# Patient Record
Sex: Male | Born: 1950 | Hispanic: No | Marital: Married | Smoking: Former smoker
Health system: Southern US, Community
[De-identification: ages and names within clinical notes are randomized; demographics above are authoritative.]

## PROBLEM LIST (undated history)

## (undated) ENCOUNTER — Emergency Department (HOSPITAL_COMMUNITY): Admission: EM | Payer: BC Managed Care – PPO | Source: Home / Self Care

## (undated) DIAGNOSIS — I10 Essential (primary) hypertension: Secondary | ICD-10-CM

## (undated) DIAGNOSIS — E785 Hyperlipidemia, unspecified: Secondary | ICD-10-CM

## (undated) DIAGNOSIS — R0683 Snoring: Secondary | ICD-10-CM

## (undated) DIAGNOSIS — Z973 Presence of spectacles and contact lenses: Secondary | ICD-10-CM

## (undated) DIAGNOSIS — R0602 Shortness of breath: Secondary | ICD-10-CM

## (undated) DIAGNOSIS — I6529 Occlusion and stenosis of unspecified carotid artery: Secondary | ICD-10-CM

## (undated) DIAGNOSIS — K5792 Diverticulitis of intestine, part unspecified, without perforation or abscess without bleeding: Secondary | ICD-10-CM

## (undated) DIAGNOSIS — F5109 Other insomnia not due to a substance or known physiological condition: Secondary | ICD-10-CM

## (undated) DIAGNOSIS — I2510 Atherosclerotic heart disease of native coronary artery without angina pectoris: Secondary | ICD-10-CM

## (undated) HISTORY — DX: Occlusion and stenosis of unspecified carotid artery: I65.29

## (undated) HISTORY — PX: COLONOSCOPY: SHX174

## (undated) HISTORY — PX: CARDIAC CATHETERIZATION: SHX172

## (undated) HISTORY — DX: Other insomnia not due to a substance or known physiological condition: F51.09

## (undated) HISTORY — DX: Snoring: R06.83

## (undated) HISTORY — DX: Essential (primary) hypertension: I10

## (undated) HISTORY — PX: ROOT CANAL: SHX2363

---

## 2004-09-08 ENCOUNTER — Ambulatory Visit (HOSPITAL_COMMUNITY): Admission: RE | Admit: 2004-09-08 | Discharge: 2004-09-08 | Payer: Self-pay | Admitting: Family Medicine

## 2004-09-19 ENCOUNTER — Encounter: Admission: RE | Admit: 2004-09-19 | Discharge: 2004-09-19 | Payer: Self-pay | Admitting: Family Medicine

## 2008-11-07 ENCOUNTER — Encounter: Admission: RE | Admit: 2008-11-07 | Discharge: 2008-11-07 | Payer: Self-pay | Admitting: Family Medicine

## 2010-08-26 ENCOUNTER — Encounter: Payer: Self-pay | Admitting: Family Medicine

## 2014-02-14 ENCOUNTER — Other Ambulatory Visit: Payer: Self-pay | Admitting: Cardiology

## 2014-02-14 NOTE — H&P (Signed)
HISTORY AND PHYSICAL    Ryan Douglas, Ryan Douglas  Date of visit:  02/14/2014 DOB:  July 19, 1951    Age:  63 yrs. Medical record number:  [id number]     Account number:  [id number] Primary Care Provider: [name], [name] ____________________________ CURRENT DIAGNOSES  1. Chest Pain  2. Abnormal Exercise Stress Test  3. Angina Pectoris  4. Hyperlipidemia ____________________________ ALLERGIES  No Known Allergies ____________________________ MEDICATIONS  1. aspirin 81 mg chewable tablet, 1 p.o. daily  2. multivitamin tablet, 1 p.o. daily  3. Miralax 17 gram/dose oral powder, PRN  4. Align 4 mg capsule, Take as directed  5. metoprolol succinate ER 25 mg tablet,extended release 24 hr, 1 p.o. daily ____________________________ CHIEF COMPLAINTS  Chest discomfort with walking ____________________________ HISTORY OF PRESENT ILLNESS  This very nice 63 year old black male is seen at the request of Dr. [name] for evaluation of chest pain. The patient has previously been in good health except for a history of hyperlipidemia and a history of diverticulosis. He has no prior cardiac history but presents with a two-month history of progressive exertional shortness of breath and midsternal tightness with radiation to the jaw. The patient formally was able to jog and was able to go on a 4 mile walk with his dog this but starting this winter began to have cold-induced exertional chest tightness. The past 2 months his tightness and shortness of breath has become progressive and typically will be relieved with rest and will recur shortly after resumption of exertion.  He has had no symptoms at rest. His parents lived to be an old age. He does have untreated hyperlipidemia but does not have any history of hypertension or diabetes. He quit smoking over 20 years ago and smoked only briefly. He has had to curtail his activities because of the exertional chest discomfort. He has a somewhat sedentary job. He denies PND, orthopnea,  syncope, or claudication.  He exercised on a treadmill today and walked only 3:37 minutes and had 2-3 mm of ST segment depression in the anterolateral leads that became associated with T-wave inversions. He had typical angina. He was started on treatment with metoprolol 25 mg extended release, Crestor 10 mg daily and continued on aspirin. It was recommended that he undergo cardiac catheterization and he is agreeable.  ____________________________ PAST HISTORY  Past Medical Illnesses:  hyperlipidemia, diverticulitis;  Cardiovascular Illnesses:  no previous history of cardiac disease.;  Surgical Procedures:  no prior surgical procedures;  Cardiology Procedures-Invasive:  no history of prior cardiac procedures;  Cardiology Procedures-Noninvasive:  treadmill;  LVEF not documented,   ____________________________ CARDIO-PULMONARY TEST DATES EKG Date:  02/14/2014;  Chest Xray Date: 02/09/2014;   ____________________________ FAMILY HISTORY Brother -- Brother dead, Death of unknown cause Father -- Father dead, CVA Mother -- Mother dead, Carcinoma of breast, Coronary Artery Disease Sister -- Sister alive and well ____________________________ SOCIAL HISTORY Alcohol Use:  no alcohol use;  Smoking:  used to smoke but quit 1994, 20 pack year history;  Diet:  fat modified diet and vegetarian;  Lifestyle:  married;  Exercise:  walking for approximately 45 minutes 4 days per week;  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 Eyes: wears eye glasses/contact lenses, denies diplopia, glaucoma or visual field defects. Ears, Nose, Throat, Mouth:  denies any hearing loss, epistaxis, hoarseness or difficulty speaking. Respiratory: denies dyspnea,  cough, wheezing or hemoptysis. Cardiovascular:  please review HPI Abdominal: constipation  Genitourinary-Male: no dysuria, urgency, frequency, or nocturia  Musculoskeletal:  chronic low back pain Neurological:  denies headaches, stroke, or TIA Psychiatric:  denies depession or anxiety  ____________________________ PHYSICAL EXAMINATION VITAL SIGNS  Blood Pressure:  124/60 Sitting, Right arm, regular cuff  , 128/66 Standing, Right arm and regular cuff   Pulse:  78/min. Weight:  188.00 lbs. Height:  71"BMI: 26  Constitutional:  pleasant African Americian male in no acute distress Skin:  warm and dry to touch, no apparent skin lesions, or masses noted. Head:  normocephalic, normal hair pattern, no masses or tenderness Eyes:  EOMS Intact, PERRLA, Douglas and S clear, Funduscopic exam not done. ENT:  ears, nose and throat reveal no gross abnormalities.  Dentition good. Neck:  supple, without massess. No JVD, thyromegaly or carotid bruits. Carotid upstroke normal. Chest:  normal symmetry, clear to auscultation. Cardiac:  regular rhythm, normal S1 and S2, No S3 or S4, no murmurs, gallops or rubs detected. Abdomen:  abdomen soft,non-tender, no masses, no hepatospenomegaly, or aneurysm noted Peripheral Pulses:  the femoral,dorsalis pedis, and posterior tibial pulses are full and equal bilaterally with no bruits auscultated. Extremities & Back:  no deformities, clubbing, cyanosis, erythema or edema observed. Normal muscle strength and tone. Neurological:  no gross motor or sensory deficits noted, affect appropriate, oriented x3. ___________________________ IMPRESSIONS/PLAN  1. Clinical story suggestive of progressive angina with an abnormal exercise treadmill test a low-level work 2. Untreated hyperlipidemia 3. Abnormal exercise tolerance test  RECOMMENDATIONS:  The patient has a suggestive clinical story of angina and an abnormal exercise treadmill test at a low level of work with reproduction of angina. He was started on metoprolol extended release 25 mg daily. I recommended that he undergo  cardiac catheterization. Cardiac catheterization was discussed with the patient including risks of myocardial infarction, death, stroke, bleeding, arrhythmia, dye allergy, or renal insufficiency. He understands and is willing to proceed. Possibility of percutaneous intervention at the same setting was also discussed with the patient including risks. We also discussed potential for bypass grafting if he has left main or severe disease. He will restrict his activities as the catheterization is planned in 3 days. ____________________________ TODAYS ORDERS  1. Draw PT/INR: Today  2. PTT: Today  3. 12 Lead EKG: Today                       ____________________________ Cardiology Physician:  Darden Palmer MD [facility]

## 2014-02-16 ENCOUNTER — Encounter (HOSPITAL_COMMUNITY): Payer: Self-pay | Admitting: Pharmacy Technician

## 2014-02-17 ENCOUNTER — Encounter (HOSPITAL_COMMUNITY)
Admission: RE | Disposition: A | Discharge status: Home / Self Care | Payer: Self-pay | Source: Ambulatory Visit | Attending: Cardiology

## 2014-02-17 ENCOUNTER — Ambulatory Visit (HOSPITAL_COMMUNITY)
Admission: RE | Admit: 2014-02-17 | Discharge: 2014-02-17 | Disposition: A | Discharge status: Home / Self Care | Payer: BC Managed Care – PPO | Source: Ambulatory Visit | Attending: Cardiology | Admitting: Cardiology

## 2014-02-17 ENCOUNTER — Encounter (HOSPITAL_COMMUNITY): Payer: Self-pay | Admitting: Cardiology

## 2014-02-17 DIAGNOSIS — E785 Hyperlipidemia, unspecified: Secondary | ICD-10-CM | POA: Insufficient documentation

## 2014-02-17 DIAGNOSIS — Z87891 Personal history of nicotine dependence: Secondary | ICD-10-CM | POA: Insufficient documentation

## 2014-02-17 DIAGNOSIS — I2582 Chronic total occlusion of coronary artery: Secondary | ICD-10-CM | POA: Insufficient documentation

## 2014-02-17 DIAGNOSIS — Z7982 Long term (current) use of aspirin: Secondary | ICD-10-CM | POA: Insufficient documentation

## 2014-02-17 DIAGNOSIS — I200 Unstable angina: Secondary | ICD-10-CM | POA: Insufficient documentation

## 2014-02-17 DIAGNOSIS — I2510 Atherosclerotic heart disease of native coronary artery without angina pectoris: Secondary | ICD-10-CM

## 2014-02-17 HISTORY — DX: Atherosclerotic heart disease of native coronary artery without angina pectoris: I25.10

## 2014-02-17 HISTORY — PX: LEFT HEART CATHETERIZATION WITH CORONARY ANGIOGRAM: SHX5451

## 2014-02-17 LAB — PROTIME-INR
INR: 1.11 (ref 0.00–1.49)
Prothrombin Time: 14.3 seconds (ref 11.6–15.2)

## 2014-02-17 SURGERY — LEFT HEART CATHETERIZATION WITH CORONARY ANGIOGRAM
Anesthesia: LOCAL

## 2014-02-17 MED ORDER — SODIUM CHLORIDE 0.9 % IV SOLN: 250.0000 mL | INTRAVENOUS | Status: DC | PRN

## 2014-02-17 MED ORDER — NITROGLYCERIN 0.2 MG/ML ON CALL CATH LAB
INTRAVENOUS | Status: AC
  Filled 2014-02-17: qty 1

## 2014-02-17 MED ORDER — HEPARIN (PORCINE) IN NACL 2-0.9 UNIT/ML-% IJ SOLN
INTRAMUSCULAR | Status: AC
  Filled 2014-02-17: qty 500

## 2014-02-17 MED ORDER — HEPARIN (PORCINE) IN NACL 2-0.9 UNIT/ML-% IJ SOLN
INTRAMUSCULAR | Status: AC
  Filled 2014-02-17: qty 1000

## 2014-02-17 MED ORDER — SODIUM CHLORIDE 0.9 % IV SOLN
INTRAVENOUS | Status: DC
  Administered 2014-02-17: 07:00:00 via INTRAVENOUS

## 2014-02-17 MED ORDER — SODIUM CHLORIDE 0.9 % IV SOLN: 1.0000 mL/kg/h | INTRAVENOUS | Status: DC

## 2014-02-17 MED ORDER — SODIUM CHLORIDE 0.9 % IJ SOLN: 3.0000 mL | INTRAMUSCULAR | Status: DC | PRN

## 2014-02-17 MED ORDER — ASPIRIN 81 MG PO CHEW: 81.0000 mg | CHEWABLE_TABLET | ORAL | Status: DC

## 2014-02-17 MED ORDER — SODIUM CHLORIDE 0.9 % IJ SOLN: 3.0000 mL | Freq: Two times a day (BID) | INTRAMUSCULAR | Status: DC

## 2014-02-17 MED ORDER — MIDAZOLAM HCL 2 MG/2ML IJ SOLN
INTRAMUSCULAR | Status: AC
Start: 2014-02-17 — End: 2014-02-17
  Filled 2014-02-17: qty 2

## 2014-02-17 MED ORDER — ASPIRIN 81 MG PO CHEW
CHEWABLE_TABLET | ORAL | Status: AC
Start: 2014-02-17 — End: 2014-02-17
  Administered 2014-02-17: 81 mg
  Filled 2014-02-17: qty 1

## 2014-02-17 MED ORDER — LIDOCAINE HCL (PF) 1 % IJ SOLN
INTRAMUSCULAR | Status: AC
  Filled 2014-02-17: qty 30

## 2014-02-17 NOTE — Progress Notes (Signed)
Voided 400 ml clear amber urine.

## 2014-02-17 NOTE — CV Procedure (Signed)
Cardiac Catheterization Report   Ryan Douglas    63 y.o.  male  DOB: 10/01/1950  MRN: [id number]  02/17/2014    PROCEDURE:  Left heart catheterization with selective coronary angiography, left ventriculogram.  INDICATIONS:  Progressive angina pectoris, strongly positive exercise treadmill test at low workload  The risks, benefits, and details of the procedure were explained to the patient.  The patient verbalized understanding and wanted to proceed.  Informed written consent was obtained.  PROCEDURE TECHNIQUE:  After Xylocaine anesthesia a 73F sheath was placed in the right femoral artery with a single anterior needle wall stick.   Left coronary angiography was done using a Judkins L4 guide catheter.  Right coronary angiography was done using a Judkins R4 guide catheter.  A 30 cc ventriculogram was performed in the 30 degree RAO projection.  Tolerated the procedure well.  Sheath removed in the holding area.   CONTRAST:  Total of 70 cc.  COMPLICATIONS:  None.    HEMODYNAMICS: Aortic postcontrast 147/87, left ventricle postcontrast 147/7-22.  There was no gradient between the left ventricle and aorta.    ANGIOGRAPHIC DATA:    CORONARY ARTERIES:   Arise and distribute normally. Right dominant. Moderate coronary calcification is noted.  Left main coronary artery: Mild irregularity.  Left anterior descending: Severe proximal 90-95% severe segmental stenosis, 70% stenosis at the margin of the diagonal branch. 95% stenosis at the apex. The LAD wraps around and supplies the distal inferior wall. Collateral  filling is seen of the circumflex and the distal right coronary artery  Circumflex coronary artery,: Severe proximal stenosis and is then totally occluded. Several marginal branches arise, large third marginal and a posterior lateral branch arise after a section of total occlusion and fills by collaterals from the left coronary and distal right  coronary artery   Right coronary artery: Severe 99% segmental proximal stenosis, distal 70-80% stenosis. The vessel supplies collaterals to septal perforator is well as the circumflex area and collaterals to the distal posterior descending artery arise from the left coronary system.  LEFT VENTRICULOGRAM:  Performed in the 30 RAO projection.  The aortic and mitral valves are normal. The left ventricle is normal in size with normal wall motion. Estimated ejection fraction is  60%.  IMPRESSIONS:  1. Severe three-vessel coronary artery disease  2. Normal left ventricle.Marland Kitchen.  RECOMMENDATION: referral for coronary artery bypass grafting. The stenoses do not appear amenable for percutaneous intervention.    Ryan Douglas, Jr. MD [facility]

## 2014-02-17 NOTE — Interval H&P Note (Signed)
History and Physical Interval Note:  02/17/2014 7:39 AM    Patient seen and examined.  No interval change in history and exam since last note.  Stable for procedure.  Darden PalmerW. Spencer Quina Wilbourne, Jr. MD [facility]  02/17/2014

## 2014-02-17 NOTE — Discharge Instructions (Signed)
Surgical Consult Appointment: Friday 02/18/2014 at 1:30pm with Dr. Tyrone SageGerhardt (arrive at least 15 minutes before appointment) [facility] [address], [place], [place] [address] [phone]  Angiogram, Care After  Refer to this sheet in the next few weeks. These instructions provide you with information on caring for yourself after your procedure. Your health care provider may also give you more specific instructions. Your treatment has been planned according to current medical practices, but problems sometimes occur. Call your health care provider if you have any problems or questions after your procedure.  WHAT TO EXPECT AFTER THE PROCEDURE After your procedure, it is typical to have the following sensations:  Minor discomfort or tenderness and a small bump at the catheter insertion site. The bump should usually decrease in size and tenderness within 1 to 2 weeks.  Any bruising will usually fade within 2 to 4 weeks. HOME CARE INSTRUCTIONS   You may need to keep taking blood thinners if they were prescribed for you. Only take over-the-counter or prescription medicines for pain, fever, or discomfort as directed by your health care provider.  Do not apply powder or lotion to the site.  Do not sit in a bathtub, swimming pool, or whirlpool for 5 to 7 days.  You may shower 24 hours after the procedure. Remove the bandage (dressing) and gently wash the site with plain soap and water. Gently pat the site dry.  Inspect the site at least twice daily.  Limit your activity for the first 24 hours. Do not bend, squat, or lift anything over 20 lb (9 kg) or as directed by your health care provider.  Do not drive home if you are discharged the day of the procedure. Have someone else drive you. Follow instructions about when you can drive or return to work. SEEK MEDICAL CARE IF:  You get lightheaded when standing up.  You have drainage (other than a small amount of  blood on the dressing).  You have chills.  You have a fever.  You have redness, warmth, swelling, or pain at the insertion site. SEEK IMMEDIATE MEDICAL CARE IF:   You develop chest pain or shortness of breath, feel faint, or pass out.  You have bleeding, swelling larger than a walnut, or drainage from the catheter insertion site.  You develop pain, discoloration, coldness, or severe bruising in the leg or arm that held the catheter.  You have heavy bleeding from the site. If this happens, hold pressure on the site. MAKE SURE YOU:  Understand these instructions.  Will watch your condition.  Will get help right away if you are not doing well or get worse. Document Released: 02/07/2005 Document Revised: 07/27/2013 Document Reviewed: 12/14/2012 [facility] Patient Information 2015 [place]. This information is not intended to replace advice given to you by your health care provider. Make sure you discuss any questions you have with your health care provider.

## 2014-02-17 NOTE — H&P (View-Only) (Signed)
HISTORY AND PHYSICAL    Ryan Douglas, Ryan Douglas  Date of visit:  02/14/2014 DOB:  July 19, 1951    Age:  63 yrs. Medical record number:  [id number]     Account number:  [id number] Primary Care Provider: [name], Ryan Douglas. ____________________________ CURRENT DIAGNOSES  1. Chest Pain  2. Abnormal Exercise Stress Test  3. Angina Pectoris  4. Hyperlipidemia ____________________________ ALLERGIES  No Known Allergies ____________________________ MEDICATIONS  1. aspirin 81 mg chewable tablet, 1 Douglas.o. daily  2. multivitamin tablet, 1 Douglas.o. daily  3. Miralax 17 gram/dose oral powder, PRN  4. Align 4 mg capsule, Take as directed  5. metoprolol succinate ER 25 mg tablet,extended release 24 hr, 1 Douglas.o. daily ____________________________ CHIEF COMPLAINTS  Chest discomfort with walking ____________________________ HISTORY OF PRESENT ILLNESS  This very nice 63 year old black male is seen at the request of Dr. Modesto Douglas for evaluation of chest pain. The patient has previously been in good health except for a history of hyperlipidemia and a history of diverticulosis. He has no prior cardiac history but presents with a two-month history of progressive exertional shortness of breath and midsternal tightness with radiation to the jaw. The patient formally was able to jog and was able to go on a 4 mile walk with his dog this but starting this winter began to have cold-induced exertional chest tightness. The past 2 months his tightness and shortness of breath has become progressive and typically will be relieved with rest and will recur shortly after resumption of exertion.  He has had no symptoms at rest. His parents lived to be an old age. He does have untreated hyperlipidemia but does not have any history of hypertension or diabetes. He quit smoking over 20 years ago and smoked only briefly. He has had to curtail his activities because of the exertional chest discomfort. He has a somewhat sedentary job. He denies PND, orthopnea,  syncope, or claudication.  He exercised on a treadmill today and walked only 3:37 minutes and had 2-3 mm of ST segment depression in the anterolateral leads that became associated with T-wave inversions. He had typical angina. He was started on treatment with metoprolol 25 mg extended release, Crestor 10 mg daily and continued on aspirin. It was recommended that he undergo cardiac catheterization and he is agreeable.  ____________________________ PAST HISTORY  Past Medical Illnesses:  hyperlipidemia, diverticulitis;  Cardiovascular Illnesses:  no previous history of cardiac disease.;  Surgical Procedures:  no prior surgical procedures;  Cardiology Procedures-Invasive:  no history of prior cardiac procedures;  Cardiology Procedures-Noninvasive:  treadmill;  LVEF not documented,   ____________________________ CARDIO-PULMONARY TEST DATES EKG Date:  02/14/2014;  Chest Xray Date: 02/09/2014;   ____________________________ FAMILY HISTORY Brother -- Brother dead, Death of unknown cause Father -- Father dead, CVA Mother -- Mother dead, Carcinoma of breast, Coronary Artery Disease Sister -- Sister alive and well ____________________________ SOCIAL HISTORY Alcohol Use:  no alcohol use;  Smoking:  used to smoke but quit 1994, 20 pack year history;  Diet:  fat modified diet and vegetarian;  Lifestyle:  married;  Exercise:  walking for approximately 45 minutes 4 days per week;  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 Eyes: wears eye glasses/contact lenses, denies diplopia, glaucoma or visual field defects. Ears, Nose, Throat, Mouth:  denies any hearing loss, epistaxis, hoarseness or difficulty speaking. Respiratory: denies dyspnea,  cough, wheezing or hemoptysis. Cardiovascular:  please review HPI Abdominal: constipation  Genitourinary-Male: no dysuria, urgency, frequency, or nocturia  Musculoskeletal:  chronic low back pain Neurological:  denies headaches, stroke, or TIA Psychiatric:  denies depession or anxiety  ____________________________ PHYSICAL EXAMINATION VITAL SIGNS  Blood Pressure:  124/60 Sitting, Right arm, regular cuff  , 128/66 Standing, Right arm and regular cuff   Pulse:  78/min. Weight:  188.00 lbs. Height:  71"BMI: 26  Constitutional:  pleasant African Americian male in no acute distress Skin:  warm and dry to touch, no apparent skin lesions, or masses noted. Head:  normocephalic, normal hair pattern, no masses or tenderness Eyes:  EOMS Intact, PERRLA, Douglas and S clear, Funduscopic exam not done. ENT:  ears, nose and throat reveal no gross abnormalities.  Dentition good. Neck:  supple, without massess. No JVD, thyromegaly or carotid bruits. Carotid upstroke normal. Chest:  normal symmetry, clear to auscultation. Cardiac:  regular rhythm, normal S1 and S2, No S3 or S4, no murmurs, gallops or rubs detected. Abdomen:  abdomen soft,non-tender, no masses, no hepatospenomegaly, or aneurysm noted Peripheral Pulses:  the femoral,dorsalis pedis, and posterior tibial pulses are full and equal bilaterally with no bruits auscultated. Extremities & Back:  no deformities, clubbing, cyanosis, erythema or edema observed. Normal muscle strength and tone. Neurological:  no gross motor or sensory deficits noted, affect appropriate, oriented x3. ___________________________ IMPRESSIONS/PLAN  1. Clinical story suggestive of progressive angina with an abnormal exercise treadmill test a low-level work 2. Untreated hyperlipidemia 3. Abnormal exercise tolerance test  RECOMMENDATIONS:  The patient has a suggestive clinical story of angina and an abnormal exercise treadmill test at a low level of work with reproduction of angina. He was started on metoprolol extended release 25 mg daily. I recommended that he undergo  cardiac catheterization. Cardiac catheterization was discussed with the patient including risks of myocardial infarction, death, stroke, bleeding, arrhythmia, dye allergy, or renal insufficiency. He understands and is willing to proceed. Possibility of percutaneous intervention at the same setting was also discussed with the patient including risks. We also discussed potential for bypass grafting if he has left main or severe disease. He will restrict his activities as the catheterization is planned in 3 days. ____________________________ TODAYS ORDERS  1. Draw PT/INR: Today  2. PTT: Today  3. 12 Lead EKG: Today                       ____________________________ Cardiology Physician:  Darden Palmer MD [facility]

## 2014-02-17 NOTE — Progress Notes (Signed)
Site area:   Site Prior to Removal:  Level   Pressure Applied For 20 MINUTES    Manual:   Yes, Kristian CoveyGina Garrett RRT pulled sheath from right femoral art.  Patient Status During Pull:  No complaint nor complications during sheath pull  Post Pull Groin Site:  Level 0  Post Pull Instructions Given:  yes  Post Pull Pulses Present: right DP 2+  Dressing Applied:  tegaderm   Bedrest [phone]  Comments tolerated well.

## 2014-02-18 ENCOUNTER — Encounter: Payer: BC Managed Care – PPO | Admitting: Cardiothoracic Surgery

## 2014-02-18 ENCOUNTER — Encounter: Payer: Self-pay | Admitting: Cardiology

## 2014-02-18 ENCOUNTER — Other Ambulatory Visit: Payer: Self-pay | Admitting: *Deleted

## 2014-02-18 ENCOUNTER — Institutional Professional Consult (permissible substitution) (INDEPENDENT_AMBULATORY_CARE_PROVIDER_SITE_OTHER): Payer: BC Managed Care – PPO | Admitting: Cardiothoracic Surgery

## 2014-02-18 VITALS — Resp 20 | Ht 71.0 in | Wt 186.0 lb

## 2014-02-18 DIAGNOSIS — I2510 Atherosclerotic heart disease of native coronary artery without angina pectoris: Secondary | ICD-10-CM

## 2014-02-18 DIAGNOSIS — E785 Hyperlipidemia, unspecified: Secondary | ICD-10-CM | POA: Insufficient documentation

## 2014-02-20 NOTE — Progress Notes (Signed)
[address]       [place] [address]             [phone]                    Madyx Delfin [facility] Medical Record #[id number] Date of Birth: [birth year]  Referring: Othella Boyer, MD Primary Care: Redmond Baseman, MD  Chief Complaint:    Chief Complaint  Patient presents with  . Coronary Artery Disease    Surgical eval for possible CABG, Cardiac Cath 02/17/14    History of Present Illness:    Ryan Douglas 63 y.o. male is seen in the office  today for consideration of CABG, referred by Dr Donnie Aho.  The patient hasbeen in good health except for a history of hyperlipidemia and a history of diverticulosis. He has no prior cardiac history but presents with a 80-month history of progressive exertional shortness of breath and midsternal tightness with radiation to the jaw. The patient formally was able to jog and was able to go on a 4 mile walk with his dog, during the winter he had  this winter began to have cold-induced "lung burning". The past 2 months his tightness and shortness of breath has become progressive and typically will be relieved with rest and will recur shortly after resumption of exertion. He has had no symptoms at rest. . He does have untreated hyperlipidemia but does not have any history of hypertension or diabetes. He quit smoking over 20 years ago and smoked only briefly.    He denies PND, orthopnea, syncope, or claudication.  He exercised on a treadmill today and walked only 3:37 minutes and had 2-3 mm of ST segment depression in the anterolateral leads that became associated with T-wave inversions. He had typical angina. He was started on treatment with metoprolol 25 mg extended release, Crestor 10 mg daily and continued on aspirin.      Current Activity/ Functional Status:  Patient is independent with mobility/ambulation, transfers, ADL's, IADL's.   Zubrod Score: At the time of surgery this patient's most appropriate  activity status/level should be described as: []     0    Normal activity, no symptoms []     1    Restricted in physical strenuous activity but ambulatory, able to do out light work []     2    Ambulatory and capable of self care, unable to do work activities, up and about               >50 % of waking hours                              []     3    Only limited self care, in bed greater than 50% of waking hours []     4    Completely disabled, no self care, confined to bed or chair []     5    Moribund   Past Medical History  Diagnosis Date  . CAD (coronary artery disease), native coronary artery 02/17/2014    02/17/14 severe 3 VD at cath with normal LV  Early positive TMT     Past Surgical History: no surgery, history of left arm fracture  Family History Mother, died 64's with breast cancer Father stroke 76 lived into 15's One brother expired unknown cause   History   Social History  . Marital Status: Married  Spouse Name: N/A    Number of Children: N/A  . Years of Education: N/A   Occupational History  .  Radio producercomputer consultant    Social History Main Topics  . Smoking status: None smoker  . Smokeless tobacco: none  . Alcohol Use: none  . Drug Use: none     History  Smoking status  . Not on file  Smokeless tobacco  . Not on file    History  Alcohol Use: Not on file     No Known Allergies  Current Outpatient Prescriptions  Medication Sig Dispense Refill  . aspirin EC 81 MG tablet Take 81 mg by mouth daily.      . metoprolol succinate (TOPROL-XL) 25 MG 24 hr tablet Take 25 mg by mouth daily.      . Multiple Vitamin (MULTIVITAMIN WITH MINERALS) TABS tablet Take 1 tablet by mouth daily.      . rosuvastatin (CRESTOR) 10 MG tablet Take 10 mg by mouth daily.       No current facility-administered medications for this visit.     Review of Systems:     Cardiac Review of Systems: Y or N  Chest Pain [  y  ]  Resting SOB [ n  ] Exertional SOB  [name][y  ]  Orthopnea [ n ]    Pedal Edema [n   ]    Palpitations [ n ] Syncope  [n  ]   Presyncope [ n  ]  General Review of Systems: [Y] = yes [  ]=no Constitional: recent weight change [n  ];  Wt loss over the last 3 months [   ] anorexia [  ]; fatigue [n  ]; nausea [  ]; night sweats [  ]; fever [  ]; or chills [  ];          Dental: poor dentition[  ]; Last Dentist visit:   Eye : blurred vision [  ]; diplopia [   ]; vision changes [  ];  Amaurosis fugax[ n ]; rt eye floters Resp: cough [  ];  wheezing[  ];  hemoptysis[  ]; shortness of breath[  ]; paroxysmal nocturnal dyspnea[  ]; dyspnea on exertion[  ]; or orthopnea[  ];  GI:  gallstones[  ], vomiting[  ];  dysphagia[  ]; melena[  ];  hematochezia [  ]; heartburn[  ];   Hx of  Colonoscopy[y  ]; GU: kidney stones [  ]; hematuria[n  ];   dysurian];  nocturia[  n];  history of     obstruction [  n]; urinary frequency [n  ]             Skin: rash, swelling[  ];, hair loss[  ];  peripheral edema[  ];  or itching[  ]; Musculosketetal: myalgias[ n ];  joint swelling[  ];  joint erythema[  ];  joint pain[ n ];  back pain[  ];  Heme/Lymph: bruising[ n ];  bleeding[n  ];  anemia[  ];  Neuro: TIA[  ];  headaches[  ];  stroke[  ];  vertigo[  ];  seizures[  ];   paresthesias[  ];  difficulty walking[  ];  Psych:depression[  ]; anxiety[  ];  Endocrine: diabetes[ n ];  thyroid dysfunction[ n ];  Immunizations: Flu up to date [?  ]; Pneumococcal up to date [ ? ];  Other:  Physical Exam: Resp 20  Ht 5\' 11"  (1.803 m)  Wt 186 lb (84.369  kg)  BMI 25.95 kg/m2  SpO2 98%  PHYSICAL EXAMINATION:  General appearance: alert, cooperative and appears stated age Neurologic: intact Heart: regular rate and rhythm, S1, S2 normal, no murmur, click, rub or gallop Lungs: clear to auscultation bilaterally Abdomen: soft, non-tender; bowel sounds normal; no masses,  no organomegaly Extremities: extremities normal, atraumatic, no cyanosis or edema and Homans sign is negative, no sign of DVT no  carotid bruits Palpable dp and pt pulses bilateral    Diagnostic Studies & Laboratory data:     Recent Radiology Findings:   No results found.    Recent Lab Findings: Lab Results  Component Value Date   INR 1.11 02/17/2014   02/17/2014  PROCEDURE: Left heart catheterization with selective coronary angiography, left ventriculogram.  INDICATIONS: Progressive angina pectoris, strongly positive exercise treadmill test at low workload  The risks, benefits, and details of the procedure were explained to the patient. The patient verbalized understanding and wanted to proceed. Informed written consent was obtained.  PROCEDURE TECHNIQUE: After Xylocaine anesthesia a 29F sheath was placed in the right femoral artery with a single anterior needle wall stick. Left coronary angiography was done using a Judkins L4 guide catheter. Right coronary angiography was done using a Judkins R4 guide catheter. A 30 cc ventriculogram was performed in the 30 degree RAO projection. Tolerated the procedure well. Sheath removed in the holding area.  CONTRAST: Total of 70 cc.  COMPLICATIONS: None.  HEMODYNAMICS: Aortic postcontrast 147/87, left ventricle postcontrast 147/7-22. There was no gradient between the left ventricle and aorta.  ANGIOGRAPHIC DATA:  CORONARY ARTERIES: Arise and distribute normally. Right dominant. Moderate coronary calcification is noted.  Left main coronary artery: Mild irregularity.  Left anterior descending: Severe proximal 90-95% severe segmental stenosis, 70% stenosis at the margin of the diagonal branch. 95% stenosis at the apex. The LAD wraps around and supplies the distal inferior wall. Collateral filling is seen of the circumflex and the distal right coronary artery  Circumflex coronary artery,: Severe proximal stenosis and is then totally occluded. Several marginal branches arise, large third marginal and a posterior lateral branch arise after a section of total occlusion and fills by  collaterals from the left coronary and distal right coronary artery  Right coronary artery: Severe 99% segmental proximal stenosis, distal 70-80% stenosis. The vessel supplies collaterals to septal perforator is well as the circumflex area and collaterals to the distal posterior descending artery arise from the left coronary system.  LEFT VENTRICULOGRAM: Performed in the 30 RAO projection. The aortic and mitral valves are normal. The left ventricle is normal in size with normal wall motion. Estimated ejection fraction is 60%.  IMPRESSIONS:  1. Severe three-vessel coronary artery disease  2. Normal left ventricle.Marland Kitchen RECOMMENDATION: referral for coronary artery bypass grafting. The stenoses do not appear amenable for percutaneous intervention.  Darden Palmer MD [facility]    Assessment / Plan:   Patient with exertional again progress over past 2 months with 3 vessel cad, I agree with cardiology recommendation to proceed with CABG. Risks and options for surgery are explained to patient wife and son He is agreeable, will plan to proceed 7/21     I spent 55 minutes counseling the patient face to face. The total time spent in the appointment was 80 minutes.  Delight Ovens MD      [address] [place] [place] [address] Office [phone]   Beeper [phone]  02/20/2014 7:07 PM

## 2014-02-21 ENCOUNTER — Encounter (HOSPITAL_COMMUNITY)
Admission: RE | Admit: 2014-02-21 | Discharge: 2014-02-21 | Disposition: A | Discharge status: Home / Self Care | Payer: BC Managed Care – PPO | Source: Ambulatory Visit | Attending: Cardiothoracic Surgery | Admitting: Cardiothoracic Surgery

## 2014-02-21 ENCOUNTER — Encounter (HOSPITAL_COMMUNITY): Payer: Self-pay

## 2014-02-21 ENCOUNTER — Ambulatory Visit (HOSPITAL_COMMUNITY)
Admission: RE | Admit: 2014-02-21 | Discharge: 2014-02-21 | Disposition: A | Discharge status: Home / Self Care | Payer: BC Managed Care – PPO | Source: Ambulatory Visit | Attending: Cardiothoracic Surgery | Admitting: Cardiothoracic Surgery

## 2014-02-21 VITALS — BP 123/75 | Temp 98.6°F | Resp 18 | Ht 71.0 in | Wt 188.1 lb

## 2014-02-21 DIAGNOSIS — Z01812 Encounter for preprocedural laboratory examination: Secondary | ICD-10-CM

## 2014-02-21 DIAGNOSIS — Z01818 Encounter for other preprocedural examination: Secondary | ICD-10-CM

## 2014-02-21 DIAGNOSIS — I2510 Atherosclerotic heart disease of native coronary artery without angina pectoris: Secondary | ICD-10-CM

## 2014-02-21 DIAGNOSIS — Z01810 Encounter for preprocedural cardiovascular examination: Secondary | ICD-10-CM

## 2014-02-21 HISTORY — DX: Presence of spectacles and contact lenses: Z97.3

## 2014-02-21 HISTORY — DX: Shortness of breath: R06.02

## 2014-02-21 HISTORY — DX: Diverticulitis of intestine, part unspecified, without perforation or abscess without bleeding: K57.92

## 2014-02-21 HISTORY — DX: Hyperlipidemia, unspecified: E78.5

## 2014-02-21 LAB — URINALYSIS, ROUTINE W REFLEX MICROSCOPIC
Bilirubin Urine: NEGATIVE
Glucose, UA: NEGATIVE mg/dL
Hgb urine dipstick: NEGATIVE
Ketones, ur: NEGATIVE mg/dL
Leukocytes, UA: NEGATIVE
Nitrite: NEGATIVE
Protein, ur: NEGATIVE mg/dL
Specific Gravity, Urine: 1.02 (ref 1.005–1.030)
Urobilinogen, UA: 0.2 mg/dL (ref 0.0–1.0)
pH: 6.5 (ref 5.0–8.0)

## 2014-02-21 LAB — PULMONARY FUNCTION TEST
DL/VA % pred: 83 %
DL/VA: 3.91 ml/min/mmHg/L
DLCO cor % pred: 74 %
DLCO cor: 25.21 ml/min/mmHg
DLCO unc % pred: 74 %
DLCO unc: 25.21 ml/min/mmHg
FEF 25-75 Post: 3.07 L/sec
FEF 25-75 Pre: 2.9 L/sec
FEF2575-%Change-Post: 5 %
FEF2575-%Pred-Post: 107 %
FEF2575-%Pred-Pre: 101 %
FEV1-%Change-Post: 2 %
FEV1-%Pred-Post: 91 %
FEV1-%Pred-Pre: 89 %
FEV1-Post: 3.29 L
FEV1-Pre: 3.22 L
FEV1FVC-%Change-Post: 2 %
FEV1FVC-%Pred-Pre: 101 %
FEV6-%Change-Post: 0 %
FEV6-%Pred-Post: 91 %
FEV6-%Pred-Pre: 91 %
FEV6-Post: 4.18 L
FEV6-Pre: 4.19 L
FEV6FVC-%Change-Post: 0 %
FEV6FVC-%Pred-Post: 103 %
FEV6FVC-%Pred-Pre: 103 %
FVC-%Change-Post: 0 %
FVC-%Pred-Post: 88 %
FVC-%Pred-Pre: 88 %
FVC-Post: 4.23 L
FVC-Pre: 4.24 L
Post FEV1/FVC ratio: 78 %
Post FEV6/FVC ratio: 99 %
Pre FEV1/FVC ratio: 76 %
Pre FEV6/FVC Ratio: 99 %
RV % pred: 60 %
RV: 1.44 L
TLC % pred: 77 %
TLC: 5.58 L

## 2014-02-21 LAB — CBC
HCT: 42.5 % (ref 39.0–52.0)
Hemoglobin: 14.5 g/dL (ref 13.0–17.0)
MCH: 32.2 pg (ref 26.0–34.0)
MCHC: 34.1 g/dL (ref 30.0–36.0)
MCV: 94.2 fL (ref 78.0–100.0)
Platelets: 250 10*3/uL (ref 150–400)
RBC: 4.51 MIL/uL (ref 4.22–5.81)
RDW: 12.4 % (ref 11.5–15.5)
WBC: 7.5 10*3/uL (ref 4.0–10.5)

## 2014-02-21 LAB — SURGICAL PCR SCREEN
MRSA, PCR: NEGATIVE
Staphylococcus aureus: NEGATIVE

## 2014-02-21 LAB — COMPREHENSIVE METABOLIC PANEL
ALT: 63 U/L — ABNORMAL HIGH (ref 0–53)
AST: 36 U/L (ref 0–37)
Albumin: 3.5 g/dL (ref 3.5–5.2)
Alkaline Phosphatase: 75 U/L (ref 39–117)
Anion gap: 14 (ref 5–15)
BUN: 9 mg/dL (ref 6–23)
CO2: 23 mEq/L (ref 19–32)
Calcium: 9 mg/dL (ref 8.4–10.5)
Chloride: 101 mEq/L (ref 96–112)
Creatinine, Ser: 0.73 mg/dL (ref 0.50–1.35)
GFR calc Af Amer: >90 mL/min (ref >90)
GFR calc non Af Amer: >90 mL/min (ref >90)
Glucose, Bld: 143 mg/dL — ABNORMAL HIGH (ref 70–99)
Potassium: 4.5 mEq/L (ref 3.7–5.3)
Sodium: 138 mEq/L (ref 137–147)
Total Bilirubin: 0.4 mg/dL (ref 0.3–1.2)
Total Protein: 7 g/dL (ref 6.0–8.3)

## 2014-02-21 LAB — BLOOD GAS, ARTERIAL
Acid-Base Excess: 2.3 mmol/L — ABNORMAL HIGH (ref 0.0–2.0)
Bicarbonate: 26.3 mEq/L — ABNORMAL HIGH (ref 20.0–24.0)
Drawn by: 344381
O2 Saturation: 97.3 %
Patient temperature: 98.6
TCO2: 27.6 mmol/L (ref 0–100)
pCO2 arterial: 41 mmHg (ref 35.0–45.0)
pH, Arterial: 7.424 (ref 7.350–7.450)
pO2, Arterial: 91.8 mmHg (ref 80.0–100.0)

## 2014-02-21 LAB — PROTIME-INR
INR: 1.14 (ref 0.00–1.49)
Prothrombin Time: 14.6 seconds (ref 11.6–15.2)

## 2014-02-21 LAB — HEMOGLOBIN A1C
Hgb A1c MFr Bld: 5.9 % — ABNORMAL HIGH (ref <5.7)
Mean Plasma Glucose: 123 mg/dL — ABNORMAL HIGH (ref <117)

## 2014-02-21 LAB — ABO/RH: ABO/RH(D): A POS

## 2014-02-21 LAB — APTT: aPTT: 26 seconds (ref 24–37)

## 2014-02-21 LAB — TYPE AND SCREEN
ABO/RH(D): A POS
Antibody Screen: NEGATIVE

## 2014-02-21 MED ORDER — PHENYLEPHRINE HCL 10 MG/ML IJ SOLN
30.0000 ug/min | INTRAVENOUS | Status: DC
  Administered 2014-02-22: 25 ug/min via INTRAVENOUS
  Filled 2014-02-21: qty 2

## 2014-02-21 MED ORDER — CHLORHEXIDINE GLUCONATE 4 % EX LIQD
30.0000 mL | CUTANEOUS | Status: DC
  Filled 2014-02-21: qty 30

## 2014-02-21 MED ORDER — DEXTROSE 5 % IV SOLN
750.0000 mg | INTRAVENOUS | Status: DC
  Filled 2014-02-21: qty 750

## 2014-02-21 MED ORDER — DOPAMINE-DEXTROSE 3.2-5 MG/ML-% IV SOLN
2.0000 ug/kg/min | INTRAVENOUS | Status: DC
  Filled 2014-02-21: qty 250

## 2014-02-21 MED ORDER — DEXMEDETOMIDINE HCL IN NACL 400 MCG/100ML IV SOLN
0.1000 ug/kg/h | INTRAVENOUS | Status: AC
  Administered 2014-02-22: 0.2 ug/kg/h via INTRAVENOUS
  Filled 2014-02-21: qty 100

## 2014-02-21 MED ORDER — SODIUM CHLORIDE 0.9 % IV SOLN
INTRAVENOUS | Status: AC
  Administered 2014-02-22: 70 mL/h via INTRAVENOUS
  Filled 2014-02-21: qty 40

## 2014-02-21 MED ORDER — SODIUM CHLORIDE 0.9 % IV SOLN
INTRAVENOUS | Status: DC
  Filled 2014-02-21: qty 30

## 2014-02-21 MED ORDER — NITROGLYCERIN IN D5W 200-5 MCG/ML-% IV SOLN
2.0000 ug/min | INTRAVENOUS | Status: AC
  Administered 2014-02-22: 16.67 ug/min via INTRAVENOUS
  Filled 2014-02-21: qty 250

## 2014-02-21 MED ORDER — SODIUM CHLORIDE 0.9 % IV SOLN
INTRAVENOUS | Status: AC
  Administered 2014-02-22: 1 [IU]/h via INTRAVENOUS
  Filled 2014-02-21: qty 1

## 2014-02-21 MED ORDER — METOPROLOL TARTRATE 12.5 MG HALF TABLET: 12.5000 mg | ORAL_TABLET | Freq: Once | ORAL | Status: DC

## 2014-02-21 MED ORDER — DEXTROSE 5 % IV SOLN
1.5000 g | INTRAVENOUS | Status: AC
  Administered 2014-02-22: 1.5 g via INTRAVENOUS
  Administered 2014-02-22: .75 g via INTRAVENOUS
  Filled 2014-02-21: qty 1.5

## 2014-02-21 MED ORDER — EPINEPHRINE HCL 1 MG/ML IJ SOLN
0.5000 ug/min | INTRAMUSCULAR | Status: DC
  Filled 2014-02-21: qty 4

## 2014-02-21 MED ORDER — VANCOMYCIN HCL 10 G IV SOLR
1250.0000 mg | INTRAVENOUS | Status: AC
  Administered 2014-02-22: 1250 mg via INTRAVENOUS
  Filled 2014-02-21 (×2): qty 1250

## 2014-02-21 MED ORDER — POTASSIUM CHLORIDE 2 MEQ/ML IV SOLN
80.0000 meq | INTRAVENOUS | Status: DC
  Filled 2014-02-21: qty 40

## 2014-02-21 MED ORDER — ALBUTEROL SULFATE (2.5 MG/3ML) 0.083% IN NEBU
2.5000 mg | INHALATION_SOLUTION | Freq: Once | RESPIRATORY_TRACT | Status: AC
  Administered 2014-02-21: 2.5 mg via RESPIRATORY_TRACT

## 2014-02-21 MED ORDER — PLASMA-LYTE 148 IV SOLN
INTRAVENOUS | Status: AC
  Administered 2014-02-22: 09:00:00
  Filled 2014-02-21: qty 2.5

## 2014-02-21 MED ORDER — MAGNESIUM SULFATE 50 % IJ SOLN
40.0000 meq | INTRAMUSCULAR | Status: DC
  Filled 2014-02-21: qty 10

## 2014-02-21 NOTE — Progress Notes (Signed)
VASCULAR LAB PRELIMINARY  PRELIMINARY  PRELIMINARY  PRELIMINARY  Pre-op Cardiac Surgery  Carotid Findings:  Bilateral:  1-39% ICA stenosis.  Vertebral artery flow is antegrade.     Upper Extremity Right Left  Brachial Pressures 122 Triphasic 128 Triphasic  Radial Waveforms Triphasic Triphasic  Ulnar Waveforms Triphasic Triphasic  Palmar Arch (Allen's Test) Normla Normal   Findings:  Doppler waveforms remained normal bilaterally with both radial and ulnar compressions    Lower  Extremity Right Left  Dorsalis Pedis    Anterior Tibial    Posterior Tibial    Ankle/Brachial Indices      Findings:  Palpable pedal pulses bilaterally   Ha Shannahan, RVS 02/21/2014, 12:55 PM

## 2014-02-21 NOTE — Progress Notes (Signed)
02/21/14 1020  OBSTRUCTIVE SLEEP APNEA  Have you ever been diagnosed with sleep apnea through a sleep study? No  Do you snore loudly (loud enough to be heard through closed doors)?  1  Do you often feel tired, fatigued, or sleepy during the daytime? 0  Has anyone observed you stop breathing during your sleep? 1  Do you have, or are you being treated for high blood pressure? 0  BMI more than 35 kg/m2? 0  Age over 64 years old? 1  Neck circumference greater than 40 cm/16 inches? 0  Gender: 1  Obstructive Sleep Apnea Score 4

## 2014-02-21 NOTE — Pre-Procedure Instructions (Addendum)
Mathieu Schloemer  02/21/2014   Your procedure is scheduled on: Tuesday, July 21.2015  Report to [facility] Admitting at 5:30 AM.  Call this number if you have problems the morning of surgery: [phone]   Remember:   Do not eat food or drink liquids after midnight tonight.   Take these medicines the morning of surgery with A SIP OF WATER: Aspirin Stop taking MULTIVITAMINS and NSAIDS ie: Ibuprofen, Advil, Naproxen or any medication containing Aspirin.   Do not wear jewelry, make-up or nail polish.  Do not wear lotions, powders, or perfumes. You may NOT wear deodorant.  Do not shave 48 hours prior to surgery. Men may shave face and neck.  Do not bring valuables to the hospital.  [facility] is not responsible for any belongings or valuables.               Contacts, dentures or bridgework may not be worn into surgery.  Leave suitcase in the car. After surgery it may be brought to your room.  For patients admitted to the hospital, discharge time is determined by your treatment team.               Patients discharged the day of surgery will not be allowed to drive home.  Name and phone number of your driver:  Special Instructions:  Special Instructions:Special Instructions: [facility] - Preparing for Surgery  Before surgery, you can play an important role.  Because skin is not sterile, your skin needs to be as free of germs as possible.  You can reduce the number of germs on you skin by washing with CHG (chlorahexidine gluconate) soap before surgery.  CHG is an antiseptic cleaner which kills germs and bonds with the skin to continue killing germs even after washing.  Please DO NOT use if you have an allergy to CHG or antibacterial soaps.  If your skin becomes reddened/irritated stop using the CHG and inform your nurse when you arrive at Short Stay.  Do not shave (including legs and underarms) for at least 48 hours prior to the first CHG shower.  You may shave your  face.  Please follow these instructions carefully:   1.  Shower with CHG Soap the night before surgery and the morning of Surgery.  2.  If you choose to wash your hair, wash your hair first as usual with your normal shampoo.  3.  After you shampoo, rinse your hair and body thoroughly to remove the Shampoo.  4.  Use CHG as you would any other liquid soap.  You can apply chg directly  to the skin and wash gently with scrungie or a clean washcloth.  5.  Apply the CHG Soap to your body ONLY FROM THE NECK DOWN.  Do not use on open wounds or open sores.  Avoid contact with your eyes, ears, mouth and genitals (private parts).  Wash genitals (private parts) with your normal soap.  6.  Wash thoroughly, paying special attention to the area where your surgery will be performed.  7.  Thoroughly rinse your body with warm water from the neck down.  8.  DO NOT shower/wash with your normal soap after using and rinsing off the CHG Soap.  9.  Pat yourself dry with a clean towel.            10.  Wear clean pajamas.            11.  Place clean sheets on your bed the  night of your first shower and do not sleep with pets.  Day of Surgery  Do not apply any lotions/deodorants the morning of surgery.  Please wear clean clothes to the hospital/surgery center.   Please read over the following fact sheets that you were given: Pain Booklet, Coughing and Deep Breathing, Blood Transfusion Information, Open Heart Packet, MRSA Information and Surgical Site Infection Prevention

## 2014-02-21 NOTE — Progress Notes (Signed)
Pt is under the care of Dr. Donnie Ahoilley ( cardiology). Pt PCP is Dr. Orvan JulyFrances Wong. Pt stated that he takes Metoprolol at night and was instructed to take Aspirin on the DOS.

## 2014-02-22 ENCOUNTER — Encounter (HOSPITAL_COMMUNITY): Payer: Self-pay | Admitting: Surgery

## 2014-02-22 ENCOUNTER — Encounter: Payer: Self-pay | Admitting: Neurology

## 2014-02-22 ENCOUNTER — Inpatient Hospital Stay (HOSPITAL_COMMUNITY): Payer: BC Managed Care – PPO | Admitting: Certified Registered"

## 2014-02-22 ENCOUNTER — Inpatient Hospital Stay (HOSPITAL_COMMUNITY): Payer: BC Managed Care – PPO

## 2014-02-22 ENCOUNTER — Inpatient Hospital Stay (HOSPITAL_COMMUNITY)
Admission: RE | Admit: 2014-02-22 | Discharge: 2014-02-27 | DRG: 236 | Disposition: A | Discharge status: Home / Self Care | Payer: BC Managed Care – PPO | Source: Ambulatory Visit | Attending: Cardiothoracic Surgery | Admitting: Cardiothoracic Surgery

## 2014-02-22 ENCOUNTER — Encounter (HOSPITAL_COMMUNITY)
Admission: RE | Disposition: A | Discharge status: Home / Self Care | Payer: BC Managed Care – PPO | Source: Ambulatory Visit | Attending: Cardiothoracic Surgery

## 2014-02-22 ENCOUNTER — Encounter (HOSPITAL_COMMUNITY): Payer: BC Managed Care – PPO | Admitting: Certified Registered"

## 2014-02-22 DIAGNOSIS — I2582 Chronic total occlusion of coronary artery: Secondary | ICD-10-CM | POA: Diagnosis present

## 2014-02-22 DIAGNOSIS — Z951 Presence of aortocoronary bypass graft: Secondary | ICD-10-CM

## 2014-02-22 DIAGNOSIS — D62 Acute posthemorrhagic anemia: Secondary | ICD-10-CM | POA: Diagnosis not present

## 2014-02-22 DIAGNOSIS — E785 Hyperlipidemia, unspecified: Secondary | ICD-10-CM | POA: Diagnosis present

## 2014-02-22 DIAGNOSIS — I200 Unstable angina: Secondary | ICD-10-CM | POA: Diagnosis present

## 2014-02-22 DIAGNOSIS — I498 Other specified cardiac arrhythmias: Secondary | ICD-10-CM | POA: Diagnosis not present

## 2014-02-22 DIAGNOSIS — I2510 Atherosclerotic heart disease of native coronary artery without angina pectoris: Principal | ICD-10-CM | POA: Diagnosis present

## 2014-02-22 DIAGNOSIS — Z87891 Personal history of nicotine dependence: Secondary | ICD-10-CM

## 2014-02-22 HISTORY — PX: CORONARY ARTERY BYPASS GRAFT: SHX141

## 2014-02-22 HISTORY — PX: INTRAOPERATIVE TRANSESOPHAGEAL ECHOCARDIOGRAM: SHX5062

## 2014-02-22 HISTORY — PX: ENDOVEIN HARVEST OF GREATER SAPHENOUS VEIN: SHX5059

## 2014-02-22 LAB — POCT I-STAT, CHEM 8
BUN: 8 mg/dL (ref 6–23)
BUN: 6 mg/dL (ref 6–23)
BUN: 6 mg/dL (ref 6–23)
BUN: 6 mg/dL (ref 6–23)
BUN: 5 mg/dL — ABNORMAL LOW (ref 6–23)
Calcium, Ion: 1.24 mmol/L (ref 1.13–1.30)
Calcium, Ion: 1.22 mmol/L (ref 1.13–1.30)
Calcium, Ion: 1.07 mmol/L — ABNORMAL LOW (ref 1.13–1.30)
Calcium, Ion: 1.11 mmol/L — ABNORMAL LOW (ref 1.13–1.30)
Calcium, Ion: 1.22 mmol/L (ref 1.13–1.30)
Chloride: 102 mEq/L (ref 96–112)
Chloride: 100 mEq/L (ref 96–112)
Chloride: 96 mEq/L (ref 96–112)
Chloride: 100 mEq/L (ref 96–112)
Chloride: 105 mEq/L (ref 96–112)
Creatinine, Ser: 0.7 mg/dL (ref 0.50–1.35)
Creatinine, Ser: 0.6 mg/dL (ref 0.50–1.35)
Creatinine, Ser: 0.7 mg/dL (ref 0.50–1.35)
Creatinine, Ser: 0.7 mg/dL (ref 0.50–1.35)
Creatinine, Ser: 0.8 mg/dL (ref 0.50–1.35)
Glucose, Bld: 109 mg/dL — ABNORMAL HIGH (ref 70–99)
Glucose, Bld: 106 mg/dL — ABNORMAL HIGH (ref 70–99)
Glucose, Bld: 102 mg/dL — ABNORMAL HIGH (ref 70–99)
Glucose, Bld: 131 mg/dL — ABNORMAL HIGH (ref 70–99)
Glucose, Bld: 144 mg/dL — ABNORMAL HIGH (ref 70–99)
HCT: 36 % — ABNORMAL LOW (ref 39.0–52.0)
HCT: 31 % — ABNORMAL LOW (ref 39.0–52.0)
HCT: 26 % — ABNORMAL LOW (ref 39.0–52.0)
HCT: 29 % — ABNORMAL LOW (ref 39.0–52.0)
HCT: 34 % — ABNORMAL LOW (ref 39.0–52.0)
Hemoglobin: 12.2 g/dL — ABNORMAL LOW (ref 13.0–17.0)
Hemoglobin: 10.5 g/dL — ABNORMAL LOW (ref 13.0–17.0)
Hemoglobin: 8.8 g/dL — ABNORMAL LOW (ref 13.0–17.0)
Hemoglobin: 9.9 g/dL — ABNORMAL LOW (ref 13.0–17.0)
Hemoglobin: 11.6 g/dL — ABNORMAL LOW (ref 13.0–17.0)
Potassium: 4 mEq/L (ref 3.7–5.3)
Potassium: 4.3 mEq/L (ref 3.7–5.3)
Potassium: 4.1 mEq/L (ref 3.7–5.3)
Potassium: 4.3 mEq/L (ref 3.7–5.3)
Potassium: 4.6 mEq/L (ref 3.7–5.3)
Sodium: 140 mEq/L (ref 137–147)
Sodium: 141 mEq/L (ref 137–147)
Sodium: 138 mEq/L (ref 137–147)
Sodium: 139 mEq/L (ref 137–147)
Sodium: 137 mEq/L (ref 137–147)
TCO2: 27 mmol/L (ref 0–100)
TCO2: 27 mmol/L (ref 0–100)
TCO2: 25 mmol/L (ref 0–100)
TCO2: 25 mmol/L (ref 0–100)
TCO2: 23 mmol/L (ref 0–100)

## 2014-02-22 LAB — POCT I-STAT 3, ART BLOOD GAS (G3+)
Acid-Base Excess: 2 mmol/L (ref 0.0–2.0)
Acid-base deficit: 1 mmol/L (ref 0.0–2.0)
Bicarbonate: 27.2 mEq/L — ABNORMAL HIGH (ref 20.0–24.0)
Bicarbonate: 24.8 mEq/L — ABNORMAL HIGH (ref 20.0–24.0)
Bicarbonate: 26 mEq/L — ABNORMAL HIGH (ref 20.0–24.0)
Bicarbonate: 25.1 mEq/L — ABNORMAL HIGH (ref 20.0–24.0)
O2 Saturation: 100 %
O2 Saturation: 97 %
O2 Saturation: 98 %
O2 Saturation: 98 %
Patient temperature: 36.2
Patient temperature: 37.9
Patient temperature: 37.7
TCO2: 29 mmol/L (ref 0–100)
TCO2: 26 mmol/L (ref 0–100)
TCO2: 27 mmol/L (ref 0–100)
TCO2: 26 mmol/L (ref 0–100)
pCO2 arterial: 46 mmHg — ABNORMAL HIGH (ref 35.0–45.0)
pCO2 arterial: 40 mmHg (ref 35.0–45.0)
pCO2 arterial: 51 mmHg — ABNORMAL HIGH (ref 35.0–45.0)
pCO2 arterial: 46.5 mmHg — ABNORMAL HIGH (ref 35.0–45.0)
pH, Arterial: 7.38 (ref 7.350–7.450)
pH, Arterial: 7.397 (ref 7.350–7.450)
pH, Arterial: 7.319 — ABNORMAL LOW (ref 7.350–7.450)
pH, Arterial: 7.343 — ABNORMAL LOW (ref 7.350–7.450)
pO2, Arterial: 319 mmHg — ABNORMAL HIGH (ref 80.0–100.0)
pO2, Arterial: 93 mmHg (ref 80.0–100.0)
pO2, Arterial: 123 mmHg — ABNORMAL HIGH (ref 80.0–100.0)
pO2, Arterial: 114 mmHg — ABNORMAL HIGH (ref 80.0–100.0)

## 2014-02-22 LAB — POCT I-STAT 4, (NA,K, GLUC, HGB,HCT)
Glucose, Bld: 107 mg/dL — ABNORMAL HIGH (ref 70–99)
HCT: 33 % — ABNORMAL LOW (ref 39.0–52.0)
Hemoglobin: 11.2 g/dL — ABNORMAL LOW (ref 13.0–17.0)
Potassium: 3.7 mEq/L (ref 3.7–5.3)
Sodium: 138 mEq/L (ref 137–147)

## 2014-02-22 LAB — CREATININE, SERUM
Creatinine, Ser: 0.79 mg/dL (ref 0.50–1.35)
GFR calc Af Amer: >90 mL/min (ref >90)
GFR calc non Af Amer: >90 mL/min (ref >90)

## 2014-02-22 LAB — CBC
HCT: 32.4 % — ABNORMAL LOW (ref 39.0–52.0)
HCT: 33.9 % — ABNORMAL LOW (ref 39.0–52.0)
Hemoglobin: 11.3 g/dL — ABNORMAL LOW (ref 13.0–17.0)
Hemoglobin: 11.6 g/dL — ABNORMAL LOW (ref 13.0–17.0)
MCH: 32.7 pg (ref 26.0–34.0)
MCH: 31.8 pg (ref 26.0–34.0)
MCHC: 34.9 g/dL (ref 30.0–36.0)
MCHC: 34.2 g/dL (ref 30.0–36.0)
MCV: 93.6 fL (ref 78.0–100.0)
MCV: 92.9 fL (ref 78.0–100.0)
PLATELETS: 152 10*3/uL (ref 150–400)
Platelets: 205 10*3/uL (ref 150–400)
RBC: 3.46 MIL/uL — AB (ref 4.22–5.81)
RBC: 3.65 MIL/uL — ABNORMAL LOW (ref 4.22–5.81)
RDW: 12.5 % (ref 11.5–15.5)
RDW: 12.5 % (ref 11.5–15.5)
WBC: 13.9 10*3/uL — ABNORMAL HIGH (ref 4.0–10.5)
WBC: 18 10*3/uL — ABNORMAL HIGH (ref 4.0–10.5)

## 2014-02-22 LAB — PROTIME-INR
INR: 1.57 — ABNORMAL HIGH (ref 0.00–1.49)
Prothrombin Time: 18.8 seconds — ABNORMAL HIGH (ref 11.6–15.2)

## 2014-02-22 LAB — APTT: aPTT: 30 seconds (ref 24–37)

## 2014-02-22 LAB — PLATELET COUNT: Platelets: 181 10*3/uL (ref 150–400)

## 2014-02-22 LAB — HEMOGLOBIN AND HEMATOCRIT, BLOOD
HCT: 27.1 % — ABNORMAL LOW (ref 39.0–52.0)
Hemoglobin: 9.6 g/dL — ABNORMAL LOW (ref 13.0–17.0)

## 2014-02-22 LAB — MAGNESIUM: Magnesium: 2.7 mg/dL — ABNORMAL HIGH (ref 1.5–2.5)

## 2014-02-22 SURGERY — CORONARY ARTERY BYPASS GRAFTING (CABG)
Anesthesia: General | Site: Leg Upper | Laterality: Right

## 2014-02-22 MED ORDER — FENTANYL CITRATE 0.05 MG/ML IJ SOLN
INTRAMUSCULAR | Status: AC
  Filled 2014-02-22: qty 5

## 2014-02-22 MED ORDER — MAGNESIUM SULFATE 4000MG/100ML IJ SOLN
4.0000 g | Freq: Once | INTRAMUSCULAR | Status: AC
  Administered 2014-02-22: 4 g via INTRAVENOUS
  Filled 2014-02-22: qty 100

## 2014-02-22 MED ORDER — ASPIRIN EC 325 MG PO TBEC
325.0000 mg | DELAYED_RELEASE_TABLET | Freq: Every day | ORAL | Status: DC
  Administered 2014-02-23: 325 mg via ORAL
  Filled 2014-02-22 (×2): qty 1

## 2014-02-22 MED ORDER — METOPROLOL TARTRATE 25 MG/10 ML ORAL SUSPENSION
12.5000 mg | Freq: Two times a day (BID) | ORAL | Status: DC
  Filled 2014-02-22 (×5): qty 5

## 2014-02-22 MED ORDER — DEXMEDETOMIDINE HCL IN NACL 200 MCG/50ML IV SOLN
0.1000 ug/kg/h | INTRAVENOUS | Status: DC
  Filled 2014-02-22: qty 50

## 2014-02-22 MED ORDER — SODIUM CHLORIDE 0.9 % IJ SOLN: 3.0000 mL | INTRAMUSCULAR | Status: DC | PRN

## 2014-02-22 MED ORDER — DOCUSATE SODIUM 100 MG PO CAPS
200.0000 mg | ORAL_CAPSULE | Freq: Every day | ORAL | Status: DC
  Administered 2014-02-23: 200 mg via ORAL
  Filled 2014-02-22: qty 2

## 2014-02-22 MED ORDER — NITROGLYCERIN IN D5W 200-5 MCG/ML-% IV SOLN: 0.0000 ug/min | INTRAVENOUS | Status: DC

## 2014-02-22 MED ORDER — CALCIUM CHLORIDE 10 % IV SOLN
INTRAVENOUS | Status: DC | PRN
  Administered 2014-02-22: 100 mg via INTRAVENOUS

## 2014-02-22 MED ORDER — HEPARIN SODIUM (PORCINE) 1000 UNIT/ML IJ SOLN
INTRAMUSCULAR | Status: AC
  Filled 2014-02-22: qty 1

## 2014-02-22 MED ORDER — SODIUM CHLORIDE 0.9 % IJ SOLN
INTRAMUSCULAR | Status: AC
  Filled 2014-02-22: qty 30

## 2014-02-22 MED ORDER — HEPARIN SODIUM (PORCINE) 1000 UNIT/ML IJ SOLN
INTRAMUSCULAR | Status: DC | PRN
  Administered 2014-02-22: 27000 [IU] via INTRAVENOUS

## 2014-02-22 MED ORDER — OXYCODONE HCL 5 MG PO TABS
5.0000 mg | ORAL_TABLET | ORAL | Status: DC | PRN
  Administered 2014-02-22: 5 mg via ORAL
  Administered 2014-02-23 (×2): 10 mg via ORAL
  Filled 2014-02-22: qty 2
  Filled 2014-02-22: qty 1
  Filled 2014-02-22: qty 2

## 2014-02-22 MED ORDER — PANTOPRAZOLE SODIUM 40 MG PO TBEC: 40.0000 mg | DELAYED_RELEASE_TABLET | Freq: Every day | ORAL | Status: DC

## 2014-02-22 MED ORDER — MIDAZOLAM HCL 10 MG/2ML IJ SOLN
INTRAMUSCULAR | Status: AC
  Filled 2014-02-22: qty 2

## 2014-02-22 MED ORDER — LACTATED RINGERS IV SOLN: 500.0000 mL | Freq: Once | INTRAVENOUS | Status: AC | PRN

## 2014-02-22 MED ORDER — VECURONIUM BROMIDE 10 MG IV SOLR
INTRAVENOUS | Status: AC
  Filled 2014-02-22: qty 20

## 2014-02-22 MED ORDER — ACETAMINOPHEN 160 MG/5ML PO SOLN: 650.0000 mg | Freq: Once | ORAL | Status: AC

## 2014-02-22 MED ORDER — ONDANSETRON HCL 4 MG/2ML IJ SOLN
4.0000 mg | Freq: Four times a day (QID) | INTRAMUSCULAR | Status: DC | PRN
  Administered 2014-02-22: 4 mg via INTRAVENOUS
  Filled 2014-02-22: qty 2

## 2014-02-22 MED ORDER — LIDOCAINE HCL (CARDIAC) 20 MG/ML IV SOLN
INTRAVENOUS | Status: DC | PRN
  Administered 2014-02-22: 100 mg via INTRAVENOUS

## 2014-02-22 MED ORDER — SODIUM CHLORIDE 0.9 % IV SOLN
INTRAVENOUS | Status: DC | PRN
  Administered 2014-02-22: 12:00:00 via INTRAVENOUS

## 2014-02-22 MED ORDER — DEXTROSE 5 % IV SOLN
1.5000 g | Freq: Two times a day (BID) | INTRAVENOUS | Status: AC
  Administered 2014-02-22 – 2014-02-24 (×4): 1.5 g via INTRAVENOUS
  Filled 2014-02-22 (×4): qty 1.5

## 2014-02-22 MED ORDER — LACTATED RINGERS IV SOLN
INTRAVENOUS | Status: DC | PRN
  Administered 2014-02-22 (×2): via INTRAVENOUS

## 2014-02-22 MED ORDER — LACTATED RINGERS IV SOLN
INTRAVENOUS | Status: DC
  Administered 2014-02-23: 20 mL/h via INTRAVENOUS

## 2014-02-22 MED ORDER — ASPIRIN 81 MG PO CHEW: 324.0000 mg | CHEWABLE_TABLET | Freq: Every day | ORAL | Status: DC

## 2014-02-22 MED ORDER — ALBUMIN HUMAN 5 % IV SOLN
INTRAVENOUS | Status: DC | PRN
  Administered 2014-02-22: 13:00:00 via INTRAVENOUS

## 2014-02-22 MED ORDER — BISACODYL 5 MG PO TBEC
10.0000 mg | DELAYED_RELEASE_TABLET | Freq: Every day | ORAL | Status: DC
  Administered 2014-02-23: 10 mg via ORAL
  Filled 2014-02-22: qty 2

## 2014-02-22 MED ORDER — INSULIN REGULAR BOLUS VIA INFUSION
0.0000 [IU] | Freq: Three times a day (TID) | INTRAVENOUS | Status: DC
  Filled 2014-02-22: qty 10

## 2014-02-22 MED ORDER — BISACODYL 10 MG RE SUPP: 10.0000 mg | Freq: Every day | RECTAL | Status: DC

## 2014-02-22 MED ORDER — FAMOTIDINE IN NACL 20-0.9 MG/50ML-% IV SOLN
20.0000 mg | Freq: Two times a day (BID) | INTRAVENOUS | Status: AC
  Administered 2014-02-22 (×2): 20 mg via INTRAVENOUS
  Filled 2014-02-22: qty 50

## 2014-02-22 MED ORDER — ACETAMINOPHEN 650 MG RE SUPP
650.0000 mg | Freq: Once | RECTAL | Status: AC
  Administered 2014-02-22: 650 mg via RECTAL

## 2014-02-22 MED ORDER — SODIUM CHLORIDE 0.45 % IV SOLN
INTRAVENOUS | Status: DC
  Administered 2014-02-22: 14:00:00 via INTRAVENOUS

## 2014-02-22 MED ORDER — SODIUM CHLORIDE 0.9 % IV SOLN
5.0000 g/h | Freq: Once | INTRAVENOUS | Status: DC
  Filled 2014-02-22: qty 20

## 2014-02-22 MED ORDER — MIDAZOLAM HCL 2 MG/2ML IJ SOLN: 2.0000 mg | INTRAMUSCULAR | Status: DC | PRN

## 2014-02-22 MED ORDER — ROCURONIUM BROMIDE 50 MG/5ML IV SOLN
INTRAVENOUS | Status: AC
  Filled 2014-02-22: qty 1

## 2014-02-22 MED ORDER — SODIUM CHLORIDE 0.9 % IV SOLN: INTRAVENOUS | Status: DC

## 2014-02-22 MED ORDER — METOPROLOL TARTRATE 1 MG/ML IV SOLN: 2.5000 mg | INTRAVENOUS | Status: DC | PRN

## 2014-02-22 MED ORDER — VANCOMYCIN HCL IN DEXTROSE 1-5 GM/200ML-% IV SOLN
1000.0000 mg | Freq: Once | INTRAVENOUS | Status: AC
  Administered 2014-02-22: 1000 mg via INTRAVENOUS
  Filled 2014-02-22: qty 200

## 2014-02-22 MED ORDER — MORPHINE SULFATE 2 MG/ML IJ SOLN
1.0000 mg | INTRAMUSCULAR | Status: AC | PRN
  Administered 2014-02-22: 2 mg via INTRAVENOUS
  Administered 2014-02-22: 1 mg via INTRAVENOUS
  Filled 2014-02-22: qty 1

## 2014-02-22 MED ORDER — LIDOCAINE HCL (CARDIAC) 20 MG/ML IV SOLN
INTRAVENOUS | Status: AC
  Filled 2014-02-22: qty 5

## 2014-02-22 MED ORDER — GELATIN ABSORBABLE MT POWD
OROMUCOSAL | Status: DC | PRN
  Administered 2014-02-22: 09:00:00 via TOPICAL

## 2014-02-22 MED ORDER — ACETAMINOPHEN 500 MG PO TABS
1000.0000 mg | ORAL_TABLET | Freq: Four times a day (QID) | ORAL | Status: DC
Start: 2014-02-23 — End: 2014-02-24
  Administered 2014-02-22 – 2014-02-24 (×6): 1000 mg via ORAL
  Filled 2014-02-22 (×10): qty 2

## 2014-02-22 MED ORDER — MIDAZOLAM HCL 2 MG/2ML IJ SOLN
INTRAMUSCULAR | Status: AC
  Filled 2014-02-22: qty 2

## 2014-02-22 MED ORDER — VECURONIUM BROMIDE 10 MG IV SOLR
INTRAVENOUS | Status: DC | PRN
  Administered 2014-02-22: 10 mg via INTRAVENOUS
  Administered 2014-02-22 (×2): 4 mg via INTRAVENOUS
  Administered 2014-02-22: 2 mg via INTRAVENOUS

## 2014-02-22 MED ORDER — PHENYLEPHRINE HCL 10 MG/ML IJ SOLN
0.0000 ug/min | INTRAMUSCULAR | Status: DC
  Filled 2014-02-22: qty 2

## 2014-02-22 MED ORDER — LACTATED RINGERS IV SOLN
INTRAVENOUS | Status: DC | PRN
  Administered 2014-02-22: 07:00:00 via INTRAVENOUS

## 2014-02-22 MED ORDER — SODIUM CHLORIDE 0.9 % IV SOLN
10.0000 mg | INTRAVENOUS | Status: DC | PRN
  Administered 2014-02-22: 10 ug/min via INTRAVENOUS

## 2014-02-22 MED ORDER — PROTAMINE SULFATE 10 MG/ML IV SOLN
INTRAVENOUS | Status: AC
  Filled 2014-02-22: qty 10

## 2014-02-22 MED ORDER — PROPOFOL 10 MG/ML IV BOLUS
INTRAVENOUS | Status: AC
  Filled 2014-02-22: qty 20

## 2014-02-22 MED ORDER — FENTANYL CITRATE 0.05 MG/ML IJ SOLN
INTRAMUSCULAR | Status: DC | PRN
  Administered 2014-02-22 (×2): 250 ug via INTRAVENOUS
  Administered 2014-02-22: 700 ug via INTRAVENOUS
  Administered 2014-02-22: 50 ug via INTRAVENOUS
  Administered 2014-02-22 (×2): 250 ug via INTRAVENOUS

## 2014-02-22 MED ORDER — POTASSIUM CHLORIDE 10 MEQ/50ML IV SOLN
10.0000 meq | INTRAVENOUS | Status: AC
  Administered 2014-02-22 (×3): 10 meq via INTRAVENOUS

## 2014-02-22 MED ORDER — SODIUM CHLORIDE 0.9 % IJ SOLN
3.0000 mL | Freq: Two times a day (BID) | INTRAMUSCULAR | Status: DC
  Administered 2014-02-23 – 2014-02-24 (×3): 3 mL via INTRAVENOUS

## 2014-02-22 MED ORDER — HEMOSTATIC AGENTS (NO CHARGE) OPTIME
TOPICAL | Status: DC | PRN
  Administered 2014-02-22: 1 via TOPICAL

## 2014-02-22 MED ORDER — PROPOFOL 10 MG/ML IV BOLUS
INTRAVENOUS | Status: DC | PRN
  Administered 2014-02-22: 70 mg via INTRAVENOUS

## 2014-02-22 MED ORDER — 0.9 % SODIUM CHLORIDE (POUR BTL) OPTIME
TOPICAL | Status: DC | PRN
  Administered 2014-02-22: 1000 mL

## 2014-02-22 MED ORDER — ROCURONIUM BROMIDE 100 MG/10ML IV SOLN
INTRAVENOUS | Status: DC | PRN
  Administered 2014-02-22: 50 mg via INTRAVENOUS
  Administered 2014-02-22: 30 mg via INTRAVENOUS

## 2014-02-22 MED ORDER — PHENYLEPHRINE 40 MCG/ML (10ML) SYRINGE FOR IV PUSH (FOR BLOOD PRESSURE SUPPORT)
PREFILLED_SYRINGE | INTRAVENOUS | Status: AC
  Filled 2014-02-22: qty 10

## 2014-02-22 MED ORDER — SODIUM CHLORIDE 0.9 % IV SOLN
250.0000 mL | INTRAVENOUS | Status: DC
  Administered 2014-02-22: 250 mL via INTRAVENOUS

## 2014-02-22 MED ORDER — METOPROLOL TARTRATE 12.5 MG HALF TABLET
12.5000 mg | ORAL_TABLET | Freq: Two times a day (BID) | ORAL | Status: DC
  Administered 2014-02-23: 12.5 mg via ORAL
  Filled 2014-02-22 (×5): qty 1

## 2014-02-22 MED ORDER — ACETAMINOPHEN 160 MG/5ML PO SOLN: 1000.0000 mg | Freq: Four times a day (QID) | ORAL | Status: DC

## 2014-02-22 MED ORDER — MORPHINE SULFATE 2 MG/ML IJ SOLN
2.0000 mg | INTRAMUSCULAR | Status: DC | PRN
  Administered 2014-02-22 – 2014-02-23 (×2): 2 mg via INTRAVENOUS
  Filled 2014-02-22 (×3): qty 1

## 2014-02-22 MED ORDER — ALBUMIN HUMAN 5 % IV SOLN
250.0000 mL | INTRAVENOUS | Status: AC | PRN
  Administered 2014-02-22: 250 mL via INTRAVENOUS

## 2014-02-22 MED ORDER — ATORVASTATIN CALCIUM 20 MG PO TABS
20.0000 mg | ORAL_TABLET | Freq: Every day | ORAL | Status: DC
  Administered 2014-02-23 – 2014-02-26 (×4): 20 mg via ORAL
  Filled 2014-02-22 (×5): qty 1

## 2014-02-22 MED ORDER — EPHEDRINE SULFATE 50 MG/ML IJ SOLN
INTRAMUSCULAR | Status: AC
  Filled 2014-02-22: qty 1

## 2014-02-22 MED ORDER — PROTAMINE SULFATE 10 MG/ML IV SOLN
INTRAVENOUS | Status: DC | PRN
  Administered 2014-02-22: 25 mg via INTRAVENOUS
  Administered 2014-02-22: 200 mg via INTRAVENOUS

## 2014-02-22 MED ORDER — SODIUM CHLORIDE 0.9 % IV SOLN
INTRAVENOUS | Status: DC
  Administered 2014-02-23: 3.5 [IU]/h via INTRAVENOUS
  Filled 2014-02-22 (×2): qty 1

## 2014-02-22 MED ORDER — MIDAZOLAM HCL 5 MG/5ML IJ SOLN
INTRAMUSCULAR | Status: DC | PRN
  Administered 2014-02-22 (×6): 2 mg via INTRAVENOUS

## 2014-02-22 SURGICAL SUPPLY — 74 items
ATTRACTOMAT 16X20 MAGNETIC DRP (DRAPES) ×4 IMPLANT
BAG DECANTER FOR FLEXI CONT (MISCELLANEOUS) ×4 IMPLANT
BANDAGE ELASTIC 4 VELCRO ST LF (GAUZE/BANDAGES/DRESSINGS) ×4 IMPLANT
BANDAGE ELASTIC 6 VELCRO ST LF (GAUZE/BANDAGES/DRESSINGS) ×4 IMPLANT
BANDAGE GAUZE ELAST BULKY 4 IN (GAUZE/BANDAGES/DRESSINGS) ×4 IMPLANT
BLADE STERNUM SYSTEM 6 (BLADE) ×4 IMPLANT
BNDG GAUZE ELAST 4 BULKY (GAUZE/BANDAGES/DRESSINGS) ×4 IMPLANT
CANISTER SUCTION 2500CC (MISCELLANEOUS) ×4 IMPLANT
CARDIAC SUCTION (MISCELLANEOUS) ×4 IMPLANT
CATH CPB KIT GERHARDT (MISCELLANEOUS) ×4 IMPLANT
CATH THORACIC 28FR (CATHETERS) ×4 IMPLANT
CLIP FOGARTY SPRING 6M (CLIP) ×4 IMPLANT
COVER SURGICAL LIGHT HANDLE (MISCELLANEOUS) ×4 IMPLANT
CRADLE DONUT ADULT HEAD (MISCELLANEOUS) ×4 IMPLANT
DRAIN CHANNEL 28F RND 3/8 FF (WOUND CARE) ×4 IMPLANT
DRAPE CARDIOVASCULAR INCISE (DRAPES) ×1
DRAPE SLUSH/WARMER DISC (DRAPES) ×4 IMPLANT
DRAPE SRG 135X102X78XABS (DRAPES) ×3 IMPLANT
DRSG AQUACEL AG ADV 3.5X14 (GAUZE/BANDAGES/DRESSINGS) ×4 IMPLANT
ELECT BLADE 4.0 EZ CLEAN MEGAD (MISCELLANEOUS) ×4
ELECT REM PT RETURN 9FT ADLT (ELECTROSURGICAL) ×8
ELECTRODE BLDE 4.0 EZ CLN MEGD (MISCELLANEOUS) ×3 IMPLANT
ELECTRODE REM PT RTRN 9FT ADLT (ELECTROSURGICAL) ×6 IMPLANT
GLOVE BIO SURGEON STRL SZ 6.5 (GLOVE) ×28 IMPLANT
GLOVE BIO SURGEON STRL SZ7 (GLOVE) ×8 IMPLANT
GLOVE BIO SURGEON STRL SZ7.5 (GLOVE) ×8 IMPLANT
GLOVE BIOGEL PI IND STRL 6.5 (GLOVE) ×12 IMPLANT
GLOVE BIOGEL PI IND STRL 7.0 (GLOVE) ×6 IMPLANT
GLOVE BIOGEL PI INDICATOR 6.5 (GLOVE) ×4
GLOVE BIOGEL PI INDICATOR 7.0 (GLOVE) ×2
GOWN STRL REUS W/ TWL LRG LVL3 (GOWN DISPOSABLE) ×18 IMPLANT
GOWN STRL REUS W/TWL LRG LVL3 (GOWN DISPOSABLE) ×6
HEMOSTAT POWDER SURGIFOAM 1G (HEMOSTASIS) ×12 IMPLANT
HEMOSTAT SURGICEL 2X14 (HEMOSTASIS) ×4 IMPLANT
KIT BASIN OR (CUSTOM PROCEDURE TRAY) ×4 IMPLANT
KIT ROOM TURNOVER OR (KITS) ×4 IMPLANT
KIT SUCTION CATH 14FR (SUCTIONS) ×8 IMPLANT
KIT VASOVIEW W/TROCAR VH 2000 (KITS) ×4 IMPLANT
LEAD PACING MYOCARDI (MISCELLANEOUS) ×4 IMPLANT
MARKER GRAFT CORONARY BYPASS (MISCELLANEOUS) ×12 IMPLANT
NS IRRIG 1000ML POUR BTL (IV SOLUTION) ×20 IMPLANT
PACK OPEN HEART (CUSTOM PROCEDURE TRAY) ×4 IMPLANT
PAD ARMBOARD 7.5X6 YLW CONV (MISCELLANEOUS) ×8 IMPLANT
PAD ELECT DEFIB RADIOL ZOLL (MISCELLANEOUS) ×4 IMPLANT
PENCIL BUTTON HOLSTER BLD 10FT (ELECTRODE) ×4 IMPLANT
SET CARDIOPLEGIA MPS 5001102 (MISCELLANEOUS) ×4 IMPLANT
SOLUTION ANTI FOG 6CC (MISCELLANEOUS) ×4 IMPLANT
SPONGE GAUZE 4X4 12PLY (GAUZE/BANDAGES/DRESSINGS) ×8 IMPLANT
SPONGE GAUZE 4X4 12PLY STER LF (GAUZE/BANDAGES/DRESSINGS) ×8 IMPLANT
SPONGE LAP 18X18 X RAY DECT (DISPOSABLE) ×12 IMPLANT
SUT BONE WAX W31G (SUTURE) ×4 IMPLANT
SUT PROLENE 3 0 SH1 36 (SUTURE) ×8 IMPLANT
SUT PROLENE 4 0 TF (SUTURE) ×8 IMPLANT
SUT PROLENE 6 0 CC (SUTURE) ×16 IMPLANT
SUT PROLENE 7 0 BV1 MDA (SUTURE) ×8 IMPLANT
SUT PROLENE 7.0 RB 3 (SUTURE) ×4 IMPLANT
SUT PROLENE 8 0 BV175 6 (SUTURE) ×12 IMPLANT
SUT STEEL 6MS V (SUTURE) ×4 IMPLANT
SUT STEEL SZ 6 DBL 3X14 BALL (SUTURE) ×4 IMPLANT
SUT VIC AB 1 CTX 18 (SUTURE) ×8 IMPLANT
SUT VIC AB 2-0 CT1 27 (SUTURE) ×1
SUT VIC AB 2-0 CT1 TAPERPNT 27 (SUTURE) ×3 IMPLANT
SUT VIC AB 3-0 X1 27 (SUTURE) ×8 IMPLANT
SUTURE E-PAK OPEN HEART (SUTURE) ×4 IMPLANT
SYSTEM SAHARA CHEST DRAIN ATS (WOUND CARE) ×4 IMPLANT
TAPE CLOTH SURG 4X10 WHT LF (GAUZE/BANDAGES/DRESSINGS) ×4 IMPLANT
TAPE PAPER 2X10 WHT MICROPORE (GAUZE/BANDAGES/DRESSINGS) ×4 IMPLANT
TOWEL OR 17X24 6PK STRL BLUE (TOWEL DISPOSABLE) ×8 IMPLANT
TOWEL OR 17X26 10 PK STRL BLUE (TOWEL DISPOSABLE) ×8 IMPLANT
TRAY FOLEY IC TEMP SENS 16FR (CATHETERS) ×4 IMPLANT
TUBE FEEDING 8FR 16IN STR KANG (MISCELLANEOUS) ×4 IMPLANT
TUBING INSUFFLATION 10FT LAP (TUBING) ×4 IMPLANT
UNDERPAD 30X30 INCONTINENT (UNDERPADS AND DIAPERS) ×4 IMPLANT
WATER STERILE IRR 1000ML POUR (IV SOLUTION) ×8 IMPLANT

## 2014-02-22 NOTE — Progress Notes (Signed)
Utilization Review Completed.Ryan Douglas T7/21/2015  

## 2014-02-22 NOTE — Anesthesia Preprocedure Evaluation (Signed)
Anesthesia Evaluation  Patient identified by MRN, date of birth, ID band Patient awake    Reviewed: Allergy & Precautions, H&P , NPO status , Patient's Chart, lab work & pertinent test results  Airway       Dental   Pulmonary former smoker,          Cardiovascular + CAD     Neuro/Psych    GI/Hepatic   Endo/Other    Renal/GU      Musculoskeletal   Abdominal   Peds  Hematology   Anesthesia Other Findings   Reproductive/Obstetrics                           Anesthesia Physical Anesthesia Plan  ASA: III  Anesthesia Plan: General   Post-op Pain Management:    Induction: Intravenous  Airway Management Planned: Oral ETT  Additional Equipment: Arterial line, CVP, PA Cath and TEE  Intra-op Plan:   Post-operative Plan: Post-operative intubation/ventilation  Informed Consent: I have reviewed the patients History and Physical, chart, labs and discussed the procedure including the risks, benefits and alternatives for the proposed anesthesia with the patient or authorized representative who has indicated his/her understanding and acceptance.     Plan Discussed with: CRNA, Anesthesiologist and Surgeon  Anesthesia Plan Comments:         Anesthesia Quick Evaluation

## 2014-02-22 NOTE — Progress Notes (Addendum)
Patient placed back on full support due to results of ABG. NIF -22 and VC .8L. Due to ABG results will reassess again in about an hour per RN.

## 2014-02-22 NOTE — H&P (Signed)
[address]       [place] [address]             [phone]                    [facility] Medical Record #[id number] Date of Birth: 04-Jan-1951  Referring: Dr Donnie Aho Primary Care: Redmond Baseman, MD  Chief Complaint:    Chest Pain   History of Present Illness:    Ryan Douglas 63 y.o. male is seen in the office for consideration of CABG, referred by Dr Donnie Aho.  The patient has been in good health except for a history of hyperlipidemia and a history of diverticulosis. He has no prior cardiac history but presents with a 39-month history of progressive exertional shortness of breath and midsternal tightness with radiation to the jaw. The patient formally was able to jog and was able to go on a 4 mile walk with his dog, during the winter he had  this winter began to have cold-induced "lung burning". The past 2 months his tightness and shortness of breath has become progressive and typically will be relieved with rest and will recur shortly after resumption of exertion. He has had no symptoms at rest. . He does have untreated hyperlipidemia but does not have any history of hypertension or diabetes. He quit smoking over 20 years ago and smoked only briefly.    He denies PND, orthopnea, syncope, or claudication.  He exercised on a treadmill today and walked only 3:37 minutes and had 2-3 mm of ST segment depression in the anterolateral leads that became associated with T-wave inversions. He had typical angina. He was started on treatment with metoprolol 25 mg extended release, Crestor 10 mg daily and continued on aspirin.      Current Activity/ Functional Status:  Patient is independent with mobility/ambulation, transfers, ADL's, IADL's.   Zubrod Score: At the time of surgery this patient's most appropriate activity status/level should be described as: [x]     0    Normal activity, no symptoms []     1    Restricted in physical strenuous activity  but ambulatory, able to do out light work []     2    Ambulatory and capable of self care, unable to do work activities, up and about               >50 % of waking hours                              []     3    Only limited self care, in bed greater than 50% of waking hours []     4    Completely disabled, no self care, confined to bed or chair []     5    Moribund   Past Medical History  Diagnosis Date  . CAD (coronary artery disease), native coronary artery 02/17/2014    02/17/14 severe 3 VD at cath with normal LV  Early positive TMT   . Shortness of breath     with exertion  . Wears glasses   . Diverticulitis   . Hyperlipidemia     Past Surgical History: no surgery, history of left arm fracture  Family History Mother, died 26's with breast cancer Father stroke 49 lived into 22's One brother expired unknown cause   History   Social History  . Marital Status: Married  Spouse Name: N/A    Number of Children: N/A  . Years of Education: N/A   Occupational History  .  Radio producer    Social History Main Topics  . Smoking status: None smoker  . Smokeless tobacco: none  . Alcohol Use: none  . Drug Use: none     History  Smoking status  . Former Smoker  Smokeless tobacco  . Never Used    Comment: Quit smoking cigarettes in 1994    History  Alcohol Use No     No Known Allergies  Current Facility-Administered Medications  Medication Dose Route Frequency Provider Last Rate Last Dose  . aminocaproic acid (AMICAR) 10 g in sodium chloride 0.9 % 100 mL infusion   Intravenous To OR Delight Ovens, MD      . cefUROXime (ZINACEF) 1.5 g in dextrose 5 % 50 mL IVPB  1.5 g Intravenous To OR Delight Ovens, MD      . cefUROXime (ZINACEF) 750 mg in dextrose 5 % 50 mL IVPB  750 mg Intravenous To OR Delight Ovens, MD      . chlorhexidine (HIBICLENS) 4 % liquid 2 application  30 mL Topical UD Delight Ovens, MD      . dexmedetomidine (PRECEDEX) 400 MCG/100ML  infusion  0.1-0.7 mcg/kg/hr Intravenous To OR Delight Ovens, MD      . DOPamine (INTROPIN) 800 mg in dextrose 5 % 250 mL infusion  2-20 mcg/kg/min Intravenous To OR Delight Ovens, MD      . EPINEPHrine (ADRENALIN) 4,000 mcg in dextrose 5 % 250 mL infusion  0.5-20 mcg/min Intravenous To OR Delight Ovens, MD      . heparin 2,500 Units, papaverine 30 mg in electrolyte-148 (PLASMALYTE-148) 500 mL irrigation   Irrigation To OR Delight Ovens, MD      . heparin 30,000 units/NS 1000 mL solution for CELLSAVER   Other To OR Delight Ovens, MD      . insulin regular (NOVOLIN R,HUMULIN R) 1 Units/mL in sodium chloride 0.9 % 100 mL infusion   Intravenous To OR Delight Ovens, MD      . magnesium sulfate (IV Push/IM) injection 40 mEq  40 mEq Other To OR Delight Ovens, MD      . metoprolol tartrate (LOPRESSOR) tablet 12.5 mg  12.5 mg Oral Once Delight Ovens, MD      . nitroGLYCERIN 0.2 mg/mL in dextrose 5 % infusion  2-200 mcg/min Intravenous To OR Delight Ovens, MD      . phenylephrine (NEO-SYNEPHRINE) 20 mg in dextrose 5 % 250 mL infusion  30-200 mcg/min Intravenous To OR Delight Ovens, MD      . potassium chloride injection 80 mEq  80 mEq Other To OR Delight Ovens, MD      . vancomycin (VANCOCIN) 1,250 mg in sodium chloride 0.9 % 250 mL IVPB  1,250 mg Intravenous To OR Delight Ovens, MD         Review of Systems:     Cardiac Review of Systems: Y or N  Chest Pain [  y  ]  Resting SOB [ n  ] Exertional SOB  [name]  ]  Orthopnea [ n ]   Pedal Edema [n   ]    Palpitations [ n ] Syncope  [n  ]   Presyncope [ n  ]  General Review of Systems: [Y] = yes [  ]=no Constitional: recent weight change [  n  ];  Wt loss over the last 3 months [   ] anorexia [  ]; fatigue [n  ]; nausea [  ]; night sweats [  ]; fever [  ]; or chills [  ];          Dental: poor dentition[  ]; Last Dentist visit:   Eye : blurred vision [  ]; diplopia [   ]; vision changes [  ];  Amaurosis fugax[ n ];  rt eye floters Resp: cough [  ];  wheezing[  ];  hemoptysis[  ]; shortness of breath[  ]; paroxysmal nocturnal dyspnea[  ]; dyspnea on exertion[  ]; or orthopnea[  ];  GI:  gallstones[  ], vomiting[  ];  dysphagia[  ]; melena[  ];  hematochezia [  ]; heartburn[  ];   Hx of  Colonoscopy[y  ]; GU: kidney stones [  ]; hematuria[n  ];   dysurian];  nocturia[  n];  history of     obstruction [  n]; urinary frequency [n  ]             Skin: rash, swelling[  ];, hair loss[  ];  peripheral edema[  ];  or itching[  ]; Musculosketetal: myalgias[ n ];  joint swelling[  ];  joint erythema[  ];  joint pain[ n ];  back pain[  ];  Heme/Lymph: bruising[ n ];  bleeding[n  ];  anemia[  ];  Neuro: TIA[  ];  headaches[  ];  stroke[  ];  vertigo[  ];  seizures[  ];   paresthesias[  ];  difficulty walking[  ];  Psych:depression[  ]; anxiety[  ];  Endocrine: diabetes[ n ];  thyroid dysfunction[ n ];  Immunizations: Flu up to date [?  ]; Pneumococcal up to date [ ? ];  Other:  Physical Exam: BP 135/80  Pulse 62  Temp(Src) 98.1 F (36.7 C) (Oral)  Resp 18  Wt 188 lb (85.276 kg)  SpO2 98%  PHYSICAL EXAMINATION:  General appearance: alert, cooperative and appears stated age Neurologic: intact Heart: regular rate and rhythm, S1, S2 normal, no murmur, click, rub or gallop Lungs: clear to auscultation bilaterally Abdomen: soft, non-tender; bowel sounds normal; no masses,  no organomegaly Extremities: extremities normal, atraumatic, no cyanosis or edema and Homans sign is negative, no sign of DVT no carotid bruits Palpable dp and pt pulses bilateral    Diagnostic Studies & Laboratory data:     Recent Radiology Findings:  Dg Chest 2 View  02/22/2014   CLINICAL DATA:  Preoperative chest radiograph for CABG.  EXAM: CHEST  2 VIEW  COMPARISON:  Chest radiograph performed 02/10/2014  FINDINGS: The lungs are well-aerated and clear. There is no evidence of focal opacification, pleural effusion or pneumothorax.  The  heart is normal in size; the mediastinal contour is within normal limits. No acute osseous abnormalities are seen.  IMPRESSION: No acute cardiopulmonary process seen.   Electronically Signed   By: Roanna Raider M.D.   On: 02/22/2014 06:30     Recent Lab Findings: Lab Results  Component Value Date   WBC 7.5 02/21/2014   HGB 14.5 02/21/2014   HCT 42.5 02/21/2014   PLT 250 02/21/2014   GLUCOSE 143* 02/21/2014   ALT 63* 02/21/2014   AST 36 02/21/2014   NA 138 02/21/2014   K 4.5 02/21/2014   CL 101 02/21/2014   CREATININE 0.73 02/21/2014   BUN 9 02/21/2014   CO2 23 02/21/2014  INR 1.14 02/21/2014   HGBA1C 5.9* 02/21/2014   02/17/2014  PROCEDURE: Left heart catheterization with selective coronary angiography, left ventriculogram.  INDICATIONS: Progressive angina pectoris, strongly positive exercise treadmill test at low workload  The risks, benefits, and details of the procedure were explained to the patient. The patient verbalized understanding and wanted to proceed. Informed written consent was obtained.  PROCEDURE TECHNIQUE: After Xylocaine anesthesia a 52F sheath was placed in the right femoral artery with a single anterior needle wall stick. Left coronary angiography was done using a Judkins L4 guide catheter. Right coronary angiography was done using a Judkins R4 guide catheter. A 30 cc ventriculogram was performed in the 30 degree RAO projection. Tolerated the procedure well. Sheath removed in the holding area.  CONTRAST: Total of 70 cc.  COMPLICATIONS: None.  HEMODYNAMICS: Aortic postcontrast 147/87, left ventricle postcontrast 147/7-22. There was no gradient between the left ventricle and aorta.  ANGIOGRAPHIC DATA:  CORONARY ARTERIES: Arise and distribute normally. Right dominant. Moderate coronary calcification is noted.  Left main coronary artery: Mild irregularity.  Left anterior descending: Severe proximal 90-95% severe segmental stenosis, 70% stenosis at the margin of the diagonal branch.  95% stenosis at the apex. The LAD wraps around and supplies the distal inferior wall. Collateral filling is seen of the circumflex and the distal right coronary artery  Circumflex coronary artery,: Severe proximal stenosis and is then totally occluded. Several marginal branches arise, large third marginal and a posterior lateral branch arise after a section of total occlusion and fills by collaterals from the left coronary and distal right coronary artery  Right coronary artery: Severe 99% segmental proximal stenosis, distal 70-80% stenosis. The vessel supplies collaterals to septal perforator is well as the circumflex area and collaterals to the distal posterior descending artery arise from the left coronary system.  LEFT VENTRICULOGRAM: Performed in the 30 RAO projection. The aortic and mitral valves are normal. The left ventricle is normal in size with normal wall motion. Estimated ejection fraction is 60%.  IMPRESSIONS:  1. Severe three-vessel coronary artery disease  2. Normal left ventricle.Marland Kitchen. RECOMMENDATION: referral for coronary artery bypass grafting. The stenoses do not appear amenable for percutaneous intervention.  Darden PalmerW. Spencer Tilley, Jr. MD [facility]    Assessment / Plan:   Patient with exertional again progress over past 2 months with 3 vessel cad, I agree with cardiology recommendation to proceed with CABG. Risks and options for surgery are explained to patient wife and son He is agreeable, will plan to proceed 7/21   The goals risks and alternatives of the planned surgical procedure CABG  have been discussed with the patient in detail. The risks of the procedure including death, infection, stroke, myocardial infarction, bleeding, blood transfusion have all been discussed specifically.  I have quoted Orlie DakinPeter Speltz a 2 % of perioperative mortality and a complication rate as high as 20 %. The patient's questions have been answered.Orlie Dakineter Vasudevan is willing  to proceed with the planned  procedure.    Delight OvensEdward B Foy Mungia MD      [address] [place] [id number] Office [phone]   Beeper [phone]  02/22/2014 7:13 AM

## 2014-02-22 NOTE — Anesthesia Postprocedure Evaluation (Signed)
  Anesthesia Post-op Note  Patient: Ryan Douglas  Procedure(s) Performed: Procedure(s): CORONARY ARTERY BYPASS GRAFTING (CABG) x4 using left internal mammary artery and right greater saphenous vein. LIMA-LAD; SVG-OM; SVG-RCA; SVG-DIAG  (N/A) INTRAOPERATIVE TRANSESOPHAGEAL ECHOCARDIOGRAM (N/A) ENDOVEIN HARVEST OF GREATER SAPHENOUS VEIN (Right)  Patient Location: SICU  Anesthesia Type:General  Level of Consciousness: sedated and Patient remains intubated per anesthesia plan  Airway and Oxygen Therapy: Post op Ventilation / ETT in place  Post-op Pain: none  Post-op Assessment: Post-op Vital signs reviewed, Patient's Cardiovascular Status Stable, Respiratory Function Stable, Patent Airway and No signs of Nausea or vomiting  Post-op Vital Signs: stable  Last Vitals:  Filed Vitals:   02/22/14 1330  BP: 143/83  Pulse: 90  Temp:   Resp: 12    Complications: No apparent anesthesia complications

## 2014-02-22 NOTE — Progress Notes (Signed)
[facility] BRIEF SICU PROGRESS NOTE  Day of Surgery  S/P Procedure(s) (LRB): CORONARY ARTERY BYPASS GRAFTING (CABG) x4 using left internal mammary artery and right greater saphenous vein. LIMA-LAD; SVG-OM; SVG-RCA; SVG-DIAG  (N/A) INTRAOPERATIVE TRANSESOPHAGEAL ECHOCARDIOGRAM (N/A) ENDOVEIN HARVEST OF GREATER SAPHENOUS VEIN (Right)   Awake on vent.  Follows some commands.  Back up on full support vent due to hypercarbia on previous ABG AAI paced w/ stable hemodynamics on low dose Neo drip Chest tube output low UOP  > 100 mL/hr Labs okay  Plan: Continue routine early postop.  Anticipate vent wean/extubation  Douglas,Ryan H 02/22/2014 7:26 PM

## 2014-02-22 NOTE — Progress Notes (Addendum)
While reviewing last doses on home medication lists, patient informed Nurse that he took 50 mg of Metoprolol last night at 2130. Patient's wife stated after further questioning about beta blocker "we told Dr. Tyrone SageGerhardt that he takes his metoprolol at night and Dr. Tyrone SageGerhardt instructed him not to take Metoprolol DOS but to instead take aspirin the DOS".

## 2014-02-22 NOTE — Brief Op Note (Addendum)
      [address]       [place] [id number]             [phone]    02/22/2014  11:07 AM  PATIENT:  Ryan DakinPeter Douglas  63 y.o. male  PRE-OPERATIVE DIAGNOSIS:  CAD  POST-OPERATIVE DIAGNOSIS:  CAD  PROCEDURE:  Procedure(s): CORONARY ARTERY BYPASS GRAFTING (CABG) x4 LIMA-LAD; SVG-OM; SVG-RCA; SVG-DIAG INTRAOPERATIVE TRANSESOPHAGEAL ECHOCARDIOGRAM ENDOVEIN HARVEST OF GREATER SAPHENOUS VEIN (RIGHT LEG)  SURGEON:  Surgeon(s): Delight OvensEdward B Dellas Guard, MD  PHYSICIAN ASSISTANT: WAYNE GOLD PA-C  ANESTHESIA:   general  PATIENT CONDITION:  ICU - intubated and hemodynamically stable.  PRE-OPERATIVE WEIGHT: 85kg  COMPLICATIONS: NO KNOWN

## 2014-02-22 NOTE — Procedures (Signed)
Extubation Procedure Note  Patient Details:   Name: Ryan Douglas DOB: 05/29/1951 MRN: [id number]   Airway Documentation:  Airway 7.5 mm (Active)  Secured at (cm) 23 cm 02/22/2014  9:00 PM  Measured From Lips 02/22/2014  9:00 PM  Secured Location Right 02/22/2014  9:00 PM  Secured By Pink Tape 02/22/2014  9:00 PM  Site Condition Dry 02/22/2014  9:00 PM    Evaluation  O2 sats: stable throughout Complications: No apparent complications Patient did tolerate procedure well. Bilateral Breath Sounds: Clear Suctioning: Airway Yes, patient able to vocalize name and cough/clear secretions.  Pt. Had a NIF of -40 and VC of 1.6. Patient is stable at this time on 6L [place] with sats of 99%  RT will continue to monitor.   Tacy Learnurriff, Yuri Fana E 02/22/2014, 10:27 PM

## 2014-02-22 NOTE — Anesthesia Procedure Notes (Addendum)
Procedure Name: Intubation Date/Time: 02/22/2014 7:44 AM Performed by: Armandina GemmaMIRARCHI, Dewayne Severe Pre-anesthesia Checklist: Patient identified, Timeout performed, Emergency Drugs available, Suction available and Patient being monitored Patient Re-evaluated:Patient Re-evaluated prior to inductionOxygen Delivery Method: Circle system utilized Preoxygenation: Pre-oxygenation with 100% oxygen Intubation Type: IV induction Ventilation: Mask ventilation without difficulty Laryngoscope Size: Miller and 2 Grade View: Grade I Tube type: Oral Tube size: 7.5 mm Number of attempts: 1 Airway Equipment and Method: Stylet Placement Confirmation: ETT inserted through vocal cords under direct vision,  breath sounds checked- equal and bilateral and positive ETCO2 Secured at: 23 cm Tube secured with: Tape Dental Injury: Teeth and Oropharynx as per pre-operative assessment  Comments: IV induction Smith- intubation AM CRNA- atraumatic teeth and mouth as preop- bilat BS =    Procedures: Right IJ Theone MurdochSwan Ganz Catheter Insertion: [id number]: The patient was identified and consent obtained.  TO was performed, and full barrier precautions were used.  The skin was anesthetized with lidocaine-4cc plain with 25g needle.  Once the vein was located with the 22 ga. needle using ultrasound guidance , the wire was inserted into the vein.  The wire location was confirmed with ultrasound.  The tissue was dilated and the 8.5 [place] cordis catheter was carefully inserted. Afterwards Theone MurdochSwan Ganz catheter was inserted. PA catheter at 47cm.  The patient tolerated the procedure well.   CE

## 2014-02-22 NOTE — Progress Notes (Signed)
  Echocardiogram Echocardiogram Transesophageal has been performed.  Leta JunglingCooper, Haiven Nardone M 02/22/2014, 10:32 AM

## 2014-02-22 NOTE — Transfer of Care (Signed)
Immediate Anesthesia Transfer of Care Note  Patient: Ryan Douglas  Procedure(s) Performed: Procedure(s): CORONARY ARTERY BYPASS GRAFTING (CABG) x4 using left internal mammary artery and right greater saphenous vein. LIMA-LAD; SVG-OM; SVG-RCA; SVG-DIAG  (N/A) INTRAOPERATIVE TRANSESOPHAGEAL ECHOCARDIOGRAM (N/A) ENDOVEIN HARVEST OF GREATER SAPHENOUS VEIN (Right)  Patient Location: SICU  Anesthesia Type:General  Level of Consciousness: unresponsive  Airway & Oxygen Therapy: Patient remains intubated per anesthesia plan and Patient placed on Ventilator (see vital sign flow sheet for setting)  Post-op Assessment: Report given to PACU RN and Post -op Vital signs reviewed and stable  Post vital signs: Reviewed and stable  Complications: No apparent anesthesia complications

## 2014-02-23 ENCOUNTER — Encounter (HOSPITAL_COMMUNITY): Payer: Self-pay | Admitting: Cardiothoracic Surgery

## 2014-02-23 ENCOUNTER — Inpatient Hospital Stay (HOSPITAL_COMMUNITY): Payer: BC Managed Care – PPO

## 2014-02-23 LAB — BASIC METABOLIC PANEL
Anion gap: 11 (ref 5–15)
BUN: 9 mg/dL (ref 6–23)
CALCIUM: 8.1 mg/dL — AB (ref 8.4–10.5)
CO2: 25 mEq/L (ref 19–32)
Chloride: 102 mEq/L (ref 96–112)
Creatinine, Ser: 0.77 mg/dL (ref 0.50–1.35)
GFR calc non Af Amer: >90 mL/min (ref >90)
Glucose, Bld: 120 mg/dL — ABNORMAL HIGH (ref 70–99)
POTASSIUM: 4.4 meq/L (ref 3.7–5.3)
SODIUM: 138 meq/L (ref 137–147)

## 2014-02-23 LAB — CBC
HCT: 32.6 % — ABNORMAL LOW (ref 39.0–52.0)
HCT: 32 % — ABNORMAL LOW (ref 39.0–52.0)
HEMOGLOBIN: 10.9 g/dL — AB (ref 13.0–17.0)
Hemoglobin: 11.1 g/dL — ABNORMAL LOW (ref 13.0–17.0)
MCH: 31.5 pg (ref 26.0–34.0)
MCH: 32.6 pg (ref 26.0–34.0)
MCHC: 34 g/dL (ref 30.0–36.0)
MCHC: 34.1 g/dL (ref 30.0–36.0)
MCV: 92.6 fL (ref 78.0–100.0)
MCV: 95.8 fL (ref 78.0–100.0)
PLATELETS: 186 10*3/uL (ref 150–400)
PLATELETS: 171 10*3/uL (ref 150–400)
RBC: 3.52 MIL/uL — ABNORMAL LOW (ref 4.22–5.81)
RBC: 3.34 MIL/uL — ABNORMAL LOW (ref 4.22–5.81)
RDW: 12.8 % (ref 11.5–15.5)
RDW: 13.1 % (ref 11.5–15.5)
WBC: 15.9 10*3/uL — ABNORMAL HIGH (ref 4.0–10.5)
WBC: 15.5 10*3/uL — AB (ref 4.0–10.5)

## 2014-02-23 LAB — GLUCOSE, CAPILLARY
Glucose-Capillary: 104 mg/dL — ABNORMAL HIGH (ref 70–99)
Glucose-Capillary: 109 mg/dL — ABNORMAL HIGH (ref 70–99)
Glucose-Capillary: 111 mg/dL — ABNORMAL HIGH (ref 70–99)
Glucose-Capillary: 112 mg/dL — ABNORMAL HIGH (ref 70–99)
Glucose-Capillary: 113 mg/dL — ABNORMAL HIGH (ref 70–99)
Glucose-Capillary: 113 mg/dL — ABNORMAL HIGH (ref 70–99)
Glucose-Capillary: 116 mg/dL — ABNORMAL HIGH (ref 70–99)
Glucose-Capillary: 116 mg/dL — ABNORMAL HIGH (ref 70–99)
Glucose-Capillary: 117 mg/dL — ABNORMAL HIGH (ref 70–99)
Glucose-Capillary: 117 mg/dL — ABNORMAL HIGH (ref 70–99)
Glucose-Capillary: 118 mg/dL — ABNORMAL HIGH (ref 70–99)
Glucose-Capillary: 119 mg/dL — ABNORMAL HIGH (ref 70–99)
Glucose-Capillary: 119 mg/dL — ABNORMAL HIGH (ref 70–99)
Glucose-Capillary: 122 mg/dL — ABNORMAL HIGH (ref 70–99)
Glucose-Capillary: 123 mg/dL — ABNORMAL HIGH (ref 70–99)
Glucose-Capillary: 124 mg/dL — ABNORMAL HIGH (ref 70–99)
Glucose-Capillary: 124 mg/dL — ABNORMAL HIGH (ref 70–99)
Glucose-Capillary: 125 mg/dL — ABNORMAL HIGH (ref 70–99)
Glucose-Capillary: 127 mg/dL — ABNORMAL HIGH (ref 70–99)
Glucose-Capillary: 129 mg/dL — ABNORMAL HIGH (ref 70–99)
Glucose-Capillary: 130 mg/dL — ABNORMAL HIGH (ref 70–99)
Glucose-Capillary: 135 mg/dL — ABNORMAL HIGH (ref 70–99)
Glucose-Capillary: 142 mg/dL — ABNORMAL HIGH (ref 70–99)
Glucose-Capillary: 150 mg/dL — ABNORMAL HIGH (ref 70–99)
Glucose-Capillary: 153 mg/dL — ABNORMAL HIGH (ref 70–99)
Glucose-Capillary: 95 mg/dL (ref 70–99)

## 2014-02-23 LAB — POCT I-STAT, CHEM 8
BUN: 11 mg/dL (ref 6–23)
Calcium, Ion: 1.19 mmol/L (ref 1.13–1.30)
Chloride: 99 mEq/L (ref 96–112)
Creatinine, Ser: 0.9 mg/dL (ref 0.50–1.35)
Glucose, Bld: 184 mg/dL — ABNORMAL HIGH (ref 70–99)
HCT: 32 % — ABNORMAL LOW (ref 39.0–52.0)
Hemoglobin: 10.9 g/dL — ABNORMAL LOW (ref 13.0–17.0)
Potassium: 4 mEq/L (ref 3.7–5.3)
Sodium: 132 mEq/L — ABNORMAL LOW (ref 137–147)
TCO2: 25 mmol/L (ref 0–100)

## 2014-02-23 LAB — CREATININE, SERUM: CREATININE: 0.8 mg/dL (ref 0.50–1.35)

## 2014-02-23 LAB — MAGNESIUM
MAGNESIUM: 2.1 mg/dL (ref 1.5–2.5)
Magnesium: 2 mg/dL (ref 1.5–2.5)

## 2014-02-23 MED ORDER — KETOROLAC TROMETHAMINE 15 MG/ML IJ SOLN
15.0000 mg | Freq: Four times a day (QID) | INTRAMUSCULAR | Status: AC | PRN
  Administered 2014-02-23 (×2): 15 mg via INTRAVENOUS
  Filled 2014-02-23 (×2): qty 1

## 2014-02-23 MED ORDER — INSULIN ASPART 100 UNIT/ML ~~LOC~~ SOLN
0.0000 [IU] | SUBCUTANEOUS | Status: DC
  Administered 2014-02-23 – 2014-02-24 (×4): 2 [IU] via SUBCUTANEOUS

## 2014-02-23 MED ORDER — INSULIN DETEMIR 100 UNIT/ML ~~LOC~~ SOLN
10.0000 [IU] | Freq: Every day | SUBCUTANEOUS | Status: DC
  Filled 2014-02-23: qty 0.1

## 2014-02-23 MED ORDER — INSULIN DETEMIR 100 UNIT/ML ~~LOC~~ SOLN
10.0000 [IU] | Freq: Once | SUBCUTANEOUS | Status: AC
  Administered 2014-02-23: 10 [IU] via SUBCUTANEOUS
  Filled 2014-02-23: qty 0.1

## 2014-02-23 MED FILL — Heparin Sodium (Porcine) Inj 1000 Unit/ML: INTRAMUSCULAR | Qty: 30 | Status: AC

## 2014-02-23 MED FILL — Lidocaine HCl IV Inj 20 MG/ML: INTRAVENOUS | Qty: 5 | Status: AC

## 2014-02-23 MED FILL — Mannitol IV Soln 20%: INTRAVENOUS | Qty: 500 | Status: AC

## 2014-02-23 MED FILL — Heparin Sodium (Porcine) Inj 1000 Unit/ML: INTRAMUSCULAR | Qty: 10 | Status: AC

## 2014-02-23 MED FILL — Electrolyte-R (PH 7.4) Solution: INTRAVENOUS | Qty: 3000 | Status: AC

## 2014-02-23 MED FILL — Sodium Bicarbonate IV Soln 8.4%: INTRAVENOUS | Qty: 50 | Status: AC

## 2014-02-23 MED FILL — Sodium Chloride IV Soln 0.9%: INTRAVENOUS | Qty: 2000 | Status: AC

## 2014-02-23 MED FILL — Potassium Chloride Inj 2 mEq/ML: INTRAVENOUS | Qty: 40 | Status: AC

## 2014-02-23 MED FILL — Magnesium Sulfate Inj 50%: INTRAMUSCULAR | Qty: 10 | Status: AC

## 2014-02-23 NOTE — Progress Notes (Signed)
Patient ID: Ryan Douglas, male   DOB: 01/06/1951, 63 y.o.   MRN: [id number]  SICU Evening Rounds:  Hemodynamically stable, sinus rhythm. sats 98% Up in chair eating  Urine output ok  CBC    Component Value Date/Time   WBC 15.5* 02/23/2014 1700   RBC 3.34* 02/23/2014 1700   HGB 10.9* 02/23/2014 1741   HCT 32.0* 02/23/2014 1741   PLT 171 02/23/2014 1700   MCV 95.8 02/23/2014 1700   MCH 32.6 02/23/2014 1700   MCHC 34.1 02/23/2014 1700   RDW 13.1 02/23/2014 1700    BMET    Component Value Date/Time   NA 132* 02/23/2014 1741   K 4.0 02/23/2014 1741   CL 99 02/23/2014 1741   CO2 25 02/23/2014 0402   GLUCOSE 184* 02/23/2014 1741   BUN 11 02/23/2014 1741   CREATININE 0.90 02/23/2014 1741   CALCIUM 8.1* 02/23/2014 0402   GFRNONAA >90 02/23/2014 1700   GFRAA >90 02/23/2014 1700    Continue current plans.

## 2014-02-23 NOTE — Progress Notes (Signed)
Patient ID: Orlie Dakineter Clontz, male   DOB: 10/16/1950, 63 y.o.   MRN: [id number] TCTS DAILY ICU PROGRESS NOTE                   [address]            [place] [id number]          [phone]   1 Day Post-Op Procedure(s) (LRB): CORONARY ARTERY BYPASS GRAFTING (CABG) x4 using left internal mammary artery and right greater saphenous vein. LIMA-LAD; SVG-OM; SVG-RCA; SVG-DIAG  (N/A) INTRAOPERATIVE TRANSESOPHAGEAL ECHOCARDIOGRAM (N/A) ENDOVEIN HARVEST OF GREATER SAPHENOUS VEIN (Right)  Total Length of Stay:  LOS: 1 day   Subjective: Complaint of incisional pain  Objective: Vital signs in last 24 hours: Temp:  [97 F (36.1 C)-100.4 F (38 C)] 99 F (37.2 C) (07/22 0600) Pulse Rate:  [89-92] 90 (07/22 0600) Cardiac Rhythm:  [-] Atrial paced (07/22 0500) Resp:  [10-28] 26 (07/22 0600) BP: (83-143)/(50-83) 124/79 mmHg (07/22 0600) SpO2:  [99 %-100 %] 99 % (07/22 0600) Arterial Line BP: (85-152)/(45-84) 138/64 mmHg (07/22 0600) FiO2 (%):  [40 %-50 %] 40 % (07/21 2145) Weight:  [187 lb 13.3 oz (85.2 kg)-196 lb 10.4 oz (89.2 kg)] 196 lb 10.4 oz (89.2 kg) (07/22 0500)  Filed Weights   02/21/14 1500 02/22/14 1600 02/23/14 0500  Weight: 188 lb (85.276 kg) 187 lb 13.3 oz (85.2 kg) 196 lb 10.4 oz (89.2 kg)    Weight change: -2.7 oz (-0.076 kg)   Hemodynamic parameters for last 24 hours: PAP: (16-28)/(8-18) 27/15 mmHg CO:  [3.8 L/min-7 L/min] 7 L/min CI:  [1.9 L/min/m2-3.3 L/min/m2] 3.3 L/min/m2  Intake/Output from previous day: 07/21 0701 - 07/22 0700 In: 9528.46967.8 [I.V.:4747.8; Blood:870; IV Piggyback:1350] Out: 5275 [Urine:3255; Blood:1620; Chest Tube:400]  Intake/Output this shift:    Current Meds: Scheduled Meds: . acetaminophen  1,000 mg Oral 4 times per day   Or  . acetaminophen (TYLENOL) oral liquid 160 mg/5 mL  1,000 mg Per Tube 4 times per day  . aspirin EC  325 mg Oral Daily   Or  . aspirin  324 mg Per Tube Daily  . atorvastatin  20 mg Oral q1800  .  bisacodyl  10 mg Oral Daily   Or  . bisacodyl  10 mg Rectal Daily  . cefUROXime (ZINACEF)  IV  1.5 g Intravenous Q12H  . docusate sodium  200 mg Oral Daily  . insulin regular  0-10 Units Intravenous TID WC  . metoprolol tartrate  12.5 mg Oral BID   Or  . metoprolol tartrate  12.5 mg Per Tube BID  . [START ON 02/24/2014] pantoprazole  40 mg Oral Daily  . sodium chloride  3 mL Intravenous Q12H   Continuous Infusions: . sodium chloride 10 mL/hr at 02/23/14 0600  . sodium chloride 10 mL/hr at 02/23/14 0600  . sodium chloride 250 mL (02/23/14 0600)  . dexmedetomidine Stopped (02/22/14 1600)  . insulin (NOVOLIN-R) infusion 3.5 Units/hr (02/23/14 0704)  . lactated ringers 20 mL/hr at 02/23/14 0600  . nitroGLYCERIN Stopped (02/22/14 1800)  . phenylephrine (NEO-SYNEPHRINE) Adult infusion Stopped (02/23/14 0600)   PRN Meds:.albumin human, metoprolol, midazolam, morphine injection, ondansetron (ZOFRAN) IV, oxyCODONE, sodium chloride  General appearance: alert and cooperative Neurologic: intact Heart: regular rate and rhythm, S1, S2 normal, no murmur, click, rub or gallop Lungs: clear to auscultation bilaterally Abdomen: soft, non-tender; bowel sounds normal; no masses,  no organomegaly Extremities: extremities normal, atraumatic, no cyanosis or edema and Homans  sign is negative, no sign of DVT Wound: sternum stable  Lab Results: CBC: Recent Labs  02/22/14 1825 02/22/14 1833 02/23/14 0402  WBC 18.0*  --  15.9*  HGB 11.6* 11.6* 11.1*  HCT 33.9* 34.0* 32.6*  PLT 205  --  186   BMET:  Recent Labs  02/21/14 1113  02/22/14 1833 02/23/14 0402  NA 138  < > 137 138  K 4.5  < > 4.6 4.4  CL 101  < > 105 102  CO2 23  --   --  25  GLUCOSE 143*  < > 144* 120*  BUN 9  < > 5* 9  CREATININE 0.73  < > 0.80 0.77  CALCIUM 9.0  --   --  8.1*  < > = values in this interval not displayed.  PT/INR:  Recent Labs  02/22/14 1340  LABPROT 18.8*  INR 1.57*   Radiology: Dg Chest 2  View  02/22/2014   CLINICAL DATA:  Preoperative chest radiograph for CABG.  EXAM: CHEST  2 VIEW  COMPARISON:  Chest radiograph performed 02/10/2014  FINDINGS: The lungs are well-aerated and clear. There is no evidence of focal opacification, pleural effusion or pneumothorax.  The heart is normal in size; the mediastinal contour is within normal limits. No acute osseous abnormalities are seen.  IMPRESSION: No acute cardiopulmonary process seen.   Electronically Signed   By: Roanna Raider M.D.   On: 02/22/2014 06:30   Dg Chest Portable 1 View In Am  02/23/2014   CLINICAL DATA:  Coronary artery disease.  EXAM: PORTABLE CHEST - 1 VIEW  COMPARISON:  03/04/2014.  FINDINGS: Interim removal of endotracheal tube and NG tube. Swan-Ganz catheter left chest tube in stable position. Cardiomegaly. Prior CABG. Pulmonary vascularity normal. Persistent bibasilar atelectasis with left lower lobe infiltrate.  IMPRESSION: 1. Interim removal of endotracheal tube and NG tube. Left chest tube and Swan-Ganz catheter in stable position . 2. Persistent bibasilar atelectasis and left lower lobe infiltrate.   Electronically Signed   By: Maisie Fus  Register   On: 02/23/2014 07:47   Dg Chest Portable 1 View  02/22/2014   CLINICAL DATA:  Status post CABG  EXAM: PORTABLE CHEST - 1 VIEW  COMPARISON:  None.  FINDINGS: Left-sided chest tube in satisfactory position without new pneumothorax. Endotracheal tube with the tip 5 cm above the carina. Swan-Ganz catheter with the tip at the right ventricular outflow tract. Nasogastric tube coursing below the diaphragm.  No focal consolidation or pleural effusion. No pneumothorax. Stable cardiac silhouette. Interval CABG. No pulmonary edema.  IMPRESSION: 1. Swan-Ganz catheter with the tip at the right ventricular outflow tract. 2. Endotracheal to with the tip 5 cm above the carina. 3. Left-sided chest tube without a pneumothorax.   Electronically Signed   By: Elige Ko   On: 02/22/2014 13:53      Assessment/Plan: S/P Procedure(s) (LRB): CORONARY ARTERY BYPASS GRAFTING (CABG) x4 using left internal mammary artery and right greater saphenous vein. LIMA-LAD; SVG-OM; SVG-RCA; SVG-DIAG  (N/A) INTRAOPERATIVE TRANSESOPHAGEAL ECHOCARDIOGRAM (N/A) ENDOVEIN HARVEST OF GREATER SAPHENOUS VEIN (Right) Mobilize Diuresis Diabetes control d/c tubes/lines Continue foley due to strict I&O See progression orders Expected Acute  Blood - loss Anemia- no transfusion needed   Rahshawn Remo B 02/23/2014 7:58 AM

## 2014-02-24 ENCOUNTER — Inpatient Hospital Stay (HOSPITAL_COMMUNITY): Payer: BC Managed Care – PPO

## 2014-02-24 LAB — BASIC METABOLIC PANEL
Anion gap: 9 (ref 5–15)
BUN: 16 mg/dL (ref 6–23)
CO2: 26 mEq/L (ref 19–32)
Calcium: 8.1 mg/dL — ABNORMAL LOW (ref 8.4–10.5)
Chloride: 100 mEq/L (ref 96–112)
Creatinine, Ser: 0.9 mg/dL (ref 0.50–1.35)
GFR calc Af Amer: >90 mL/min (ref >90)
GFR calc non Af Amer: 88 mL/min — ABNORMAL LOW (ref >90)
Glucose, Bld: 132 mg/dL — ABNORMAL HIGH (ref 70–99)
Potassium: 4 mEq/L (ref 3.7–5.3)
Sodium: 135 mEq/L — ABNORMAL LOW (ref 137–147)

## 2014-02-24 LAB — GLUCOSE, CAPILLARY
Glucose-Capillary: 140 mg/dL — ABNORMAL HIGH (ref 70–99)
Glucose-Capillary: 124 mg/dL — ABNORMAL HIGH (ref 70–99)
Glucose-Capillary: 104 mg/dL — ABNORMAL HIGH (ref 70–99)
Glucose-Capillary: 155 mg/dL — ABNORMAL HIGH (ref 70–99)
Glucose-Capillary: 147 mg/dL — ABNORMAL HIGH (ref 70–99)

## 2014-02-24 LAB — CBC
HCT: 29.8 % — ABNORMAL LOW (ref 39.0–52.0)
Hemoglobin: 9.9 g/dL — ABNORMAL LOW (ref 13.0–17.0)
MCH: 31.4 pg (ref 26.0–34.0)
MCHC: 33.2 g/dL (ref 30.0–36.0)
MCV: 94.6 fL (ref 78.0–100.0)
Platelets: 166 10*3/uL (ref 150–400)
RBC: 3.15 MIL/uL — ABNORMAL LOW (ref 4.22–5.81)
RDW: 13.1 % (ref 11.5–15.5)
WBC: 14.9 10*3/uL — ABNORMAL HIGH (ref 4.0–10.5)

## 2014-02-24 MED ORDER — SODIUM CHLORIDE 0.9 % IJ SOLN: 3.0000 mL | INTRAMUSCULAR | Status: DC | PRN

## 2014-02-24 MED ORDER — OXYCODONE HCL 5 MG PO TABS
5.0000 mg | ORAL_TABLET | ORAL | Status: DC | PRN
  Administered 2014-02-24 – 2014-02-27 (×2): 10 mg via ORAL
  Filled 2014-02-24 (×2): qty 2

## 2014-02-24 MED ORDER — PANTOPRAZOLE SODIUM 40 MG PO TBEC
40.0000 mg | DELAYED_RELEASE_TABLET | Freq: Every day | ORAL | Status: DC
  Administered 2014-02-24 – 2014-02-27 (×4): 40 mg via ORAL
  Filled 2014-02-24 (×4): qty 1

## 2014-02-24 MED ORDER — INSULIN ASPART 100 UNIT/ML ~~LOC~~ SOLN
0.0000 [IU] | Freq: Three times a day (TID) | SUBCUTANEOUS | Status: DC
  Administered 2014-02-24 – 2014-02-26 (×6): 2 [IU] via SUBCUTANEOUS

## 2014-02-24 MED ORDER — BISACODYL 5 MG PO TBEC: 10.0000 mg | DELAYED_RELEASE_TABLET | Freq: Every day | ORAL | Status: DC | PRN

## 2014-02-24 MED ORDER — MOVING RIGHT ALONG BOOK
Freq: Once | Status: AC
  Administered 2014-02-24: 08:00:00
  Filled 2014-02-24: qty 1

## 2014-02-24 MED ORDER — DOCUSATE SODIUM 100 MG PO CAPS
200.0000 mg | ORAL_CAPSULE | Freq: Every day | ORAL | Status: DC
  Administered 2014-02-24 – 2014-02-26 (×3): 200 mg via ORAL
  Filled 2014-02-24 (×4): qty 2

## 2014-02-24 MED ORDER — ALPRAZOLAM 0.25 MG PO TABS: 0.2500 mg | ORAL_TABLET | Freq: Four times a day (QID) | ORAL | Status: DC | PRN

## 2014-02-24 MED ORDER — ASPIRIN EC 325 MG PO TBEC
325.0000 mg | DELAYED_RELEASE_TABLET | Freq: Every day | ORAL | Status: DC
  Administered 2014-02-24 – 2014-02-27 (×4): 325 mg via ORAL
  Filled 2014-02-24 (×4): qty 1

## 2014-02-24 MED ORDER — ONDANSETRON HCL 4 MG PO TABS: 4.0000 mg | ORAL_TABLET | Freq: Four times a day (QID) | ORAL | Status: DC | PRN

## 2014-02-24 MED ORDER — SODIUM CHLORIDE 0.9 % IV SOLN: 250.0000 mL | INTRAVENOUS | Status: DC | PRN

## 2014-02-24 MED ORDER — DIPHENHYDRAMINE HCL 25 MG PO CAPS: 25.0000 mg | ORAL_CAPSULE | Freq: Every evening | ORAL | Status: DC | PRN

## 2014-02-24 MED ORDER — TRAMADOL HCL 50 MG PO TABS
50.0000 mg | ORAL_TABLET | ORAL | Status: DC | PRN
  Administered 2014-02-24 – 2014-02-26 (×5): 100 mg via ORAL
  Filled 2014-02-24 (×5): qty 2

## 2014-02-24 MED ORDER — ONDANSETRON HCL 4 MG/2ML IJ SOLN: 4.0000 mg | Freq: Four times a day (QID) | INTRAMUSCULAR | Status: DC | PRN

## 2014-02-24 MED ORDER — METOPROLOL TARTRATE 12.5 MG HALF TABLET
12.5000 mg | ORAL_TABLET | Freq: Two times a day (BID) | ORAL | Status: DC
Start: 2014-02-24 — End: 2014-02-25
  Administered 2014-02-24 (×2): 12.5 mg via ORAL
  Filled 2014-02-24 (×4): qty 1

## 2014-02-24 MED ORDER — SODIUM CHLORIDE 0.9 % IJ SOLN
3.0000 mL | Freq: Two times a day (BID) | INTRAMUSCULAR | Status: DC
  Administered 2014-02-24 – 2014-02-26 (×4): 3 mL via INTRAVENOUS

## 2014-02-24 MED ORDER — BISACODYL 10 MG RE SUPP: 10.0000 mg | Freq: Every day | RECTAL | Status: DC | PRN

## 2014-02-24 NOTE — Progress Notes (Signed)
Patient ID: Ryan Douglas, male   DOB: 28-Mar-1951, 63 y.o.   MRN: [id number] TCTS DAILY ICU PROGRESS NOTE                   [address]            [place] [address]          [phone]   2 Days Post-Op Procedure(s) (LRB): CORONARY ARTERY BYPASS GRAFTING (CABG) x4 using left internal mammary artery and right greater saphenous vein. LIMA-LAD; SVG-OM; SVG-RCA; SVG-DIAG  (N/A) INTRAOPERATIVE TRANSESOPHAGEAL ECHOCARDIOGRAM (N/A) ENDOVEIN HARVEST OF GREATER SAPHENOUS VEIN (Right)  Total Length of Stay:  LOS: 2 days   Subjective: Stable night, ambulating well, pain better control  Objective: Vital signs in last 24 hours: Temp:  [97.3 F (36.3 C)-99 F (37.2 C)] 97.3 F (36.3 C) (07/23 0359) Pulse Rate:  [76-96] 80 (07/23 0700) Cardiac Rhythm:  [-] Normal sinus rhythm (07/23 0000) Resp:  [14-25] 18 (07/23 0700) BP: (98-128)/(54-76) 108/62 mmHg (07/23 0700) SpO2:  [94 %-100 %] 96 % (07/23 0700) Arterial Line BP: (119-125)/(49-61) 125/57 mmHg (07/22 1400) Weight:  [197 lb 1.5 oz (89.4 kg)] 197 lb 1.5 oz (89.4 kg) (07/23 0400)  Filed Weights   02/22/14 1600 02/23/14 0500 02/24/14 0400  Weight: 187 lb 13.3 oz (85.2 kg) 196 lb 10.4 oz (89.2 kg) 197 lb 1.5 oz (89.4 kg)    Weight change: 9 lb 4.2 oz (4.2 kg)   Hemodynamic parameters for last 24 hours: PAP: (17-33)/(5-16) 33/16 mmHg  Intake/Output from previous day: 07/22 0701 - 07/23 0700 In: 1055.3 [P.O.:480; I.V.:525.3; IV Piggyback:50] Out: 1310 [Urine:1310]  Intake/Output this shift:    Current Meds: Scheduled Meds: . acetaminophen  1,000 mg Oral 4 times per day   Or  . acetaminophen (TYLENOL) oral liquid 160 mg/5 mL  1,000 mg Per Tube 4 times per day  . aspirin EC  325 mg Oral Daily   Or  . aspirin  324 mg Per Tube Daily  . atorvastatin  20 mg Oral q1800  . bisacodyl  10 mg Oral Daily   Or  . bisacodyl  10 mg Rectal Daily  . docusate sodium  200 mg Oral Daily  . insulin aspart  0-24 Units  Subcutaneous 6 times per day  . insulin detemir  10 Units Subcutaneous Daily  . metoprolol tartrate  12.5 mg Oral BID   Or  . metoprolol tartrate  12.5 mg Per Tube BID  . pantoprazole  40 mg Oral Daily  . sodium chloride  3 mL Intravenous Q12H   Continuous Infusions: . sodium chloride 10 mL/hr at 02/23/14 0600  . sodium chloride 250 mL (02/23/14 0600)  . dexmedetomidine Stopped (02/22/14 1600)  . lactated ringers 20 mL/hr at 02/24/14 0600  . nitroGLYCERIN Stopped (02/22/14 1800)  . phenylephrine (NEO-SYNEPHRINE) Adult infusion Stopped (02/23/14 0600)   PRN Meds:.ketorolac, metoprolol, morphine injection, ondansetron (ZOFRAN) IV, oxyCODONE, sodium chloride  General appearance: alert, cooperative and no distress Neurologic: intact Heart: regular rate and rhythm, S1, S2 normal, no murmur, click, rub or gallop Lungs: clear to auscultation bilaterally Abdomen: soft, non-tender; bowel sounds normal; no masses,  no organomegaly Extremities: extremities normal, atraumatic, no cyanosis or edema and Homans sign is negative, no sign of DVT Wound: sternum stable  Lab Results: CBC: Recent Labs  02/23/14 1700 02/23/14 1741 02/24/14 0415  WBC 15.5*  --  14.9*  HGB 10.9* 10.9* 9.9*  HCT 32.0* 32.0* 29.8*  PLT 171  --  166   BMET:  Recent Labs  02/23/14 0402  02/23/14 1741 02/24/14 0415  NA 138  --  132* 135*  K 4.4  --  4.0 4.0  CL 102  --  99 100  CO2 25  --   --  26  GLUCOSE 120*  --  184* 132*  BUN 9  --  11 16  CREATININE 0.77  < > 0.90 0.90  CALCIUM 8.1*  --   --  8.1*  < > = values in this interval not displayed.  PT/INR:  Recent Labs  02/22/14 1340  LABPROT 18.8*  INR 1.57*   Radiology: Dg Chest Port 1 View  02/24/2014   CLINICAL DATA:  Chest tube removal  EXAM: PORTABLE CHEST - 1 VIEW  COMPARISON:  02/23/2014  FINDINGS: Left chest tube removed.  No pneumothorax.  Swan-Ganz catheter removed. Right jugular sheath in the right innominate vein.  Left lower lobe  atelectasis shows mild interval improvement. Negative for edema.  IMPRESSION: Negative for pneumothorax post left chest tube removal. Mild improvement in left lower lobe atelectasis.   Electronically Signed   By: Marlan Palauharles  Clark M.D.   On: 02/24/2014 07:48   Dg Chest Portable 1 View In Am  02/23/2014   CLINICAL DATA:  Coronary artery disease.  EXAM: PORTABLE CHEST - 1 VIEW  COMPARISON:  03/04/2014.  FINDINGS: Interim removal of endotracheal tube and NG tube. Swan-Ganz catheter left chest tube in stable position. Cardiomegaly. Prior CABG. Pulmonary vascularity normal. Persistent bibasilar atelectasis with left lower lobe infiltrate.  IMPRESSION: 1. Interim removal of endotracheal tube and NG tube. Left chest tube and Swan-Ganz catheter in stable position . 2. Persistent bibasilar atelectasis and left lower lobe infiltrate.   Electronically Signed   By: Maisie Fushomas  Register   On: 02/23/2014 07:47   Dg Chest Portable 1 View  02/22/2014   CLINICAL DATA:  Status post CABG  EXAM: PORTABLE CHEST - 1 VIEW  COMPARISON:  None.  FINDINGS: Left-sided chest tube in satisfactory position without new pneumothorax. Endotracheal tube with the tip 5 cm above the carina. Swan-Ganz catheter with the tip at the right ventricular outflow tract. Nasogastric tube coursing below the diaphragm.  No focal consolidation or pleural effusion. No pneumothorax. Stable cardiac silhouette. Interval CABG. No pulmonary edema.  IMPRESSION: 1. Swan-Ganz catheter with the tip at the right ventricular outflow tract. 2. Endotracheal to with the tip 5 cm above the carina. 3. Left-sided chest tube without a pneumothorax.   Electronically Signed   By: Elige KoHetal  Patel   On: 02/22/2014 13:53     Assessment/Plan: S/P Procedure(s) (LRB): CORONARY ARTERY BYPASS GRAFTING (CABG) x4 using left internal mammary artery and right greater saphenous vein. LIMA-LAD; SVG-OM; SVG-RCA; SVG-DIAG  (N/A) INTRAOPERATIVE TRANSESOPHAGEAL ECHOCARDIOGRAM (N/A) ENDOVEIN HARVEST  OF GREATER SAPHENOUS VEIN (Right) Mobilize Diabetes control Plan for transfer to step-down: see transfer orders     Tayten Heber B 02/24/2014 7:51 AM

## 2014-02-24 NOTE — Progress Notes (Signed)
Pt t/x to 2W19 on monitor without event. Pt wife present during tx. Gavin Poundeborah NT present for tele hookup and patient arrival  Ryan Douglas, Raylyn Carton Denise

## 2014-02-24 NOTE — Op Note (Signed)
NAME:  Ryan Douglas, Ryan Douglas            ACCOUNT NO.:  [id number]  MEDICAL RECORD NO.:  [id number]  LOCATION:  2S01C                        FACILITY:  [facility]  PHYSICIAN:  Sheliah PlaneEdward Vipul Cafarelli, MD    DATE OF BIRTH:  20-Dec-1950  DATE OF PROCEDURE:  02/22/2014 DATE OF DISCHARGE:                              OPERATIVE REPORT   PREOPERATIVE DIAGNOSIS:  Unstable angina.  POSTOPERATIVE DIAGNOSIS:  Unstable angina.  SURGICAL PROCEDURE:  Coronary artery bypass grafting x4 with the left internal mammary to the left anterior descending coronary artery, reverse saphenous vein graft to the diagonal coronary artery, reverse saphenous vein graft to the second obtuse marginal coronary artery, reverse saphenous vein graft to the distal right coronary artery with right thigh and calf endovein harvesting.  SURGEON:  Sheliah PlaneEdward Orlando Thalmann, MD  FIRST ASSISTANT:  Rowe ClackWayne E. Gold, PA-C.  BRIEF HISTORY:  The patient is a 63 year old male with no previous history of cardiac disease who presents with at least 3276-month history of progressive anginal symptoms, initially starting as "burning lung when he walked his dog during the winter and over the past 2 months, the severity of the symptoms have increased leading to a stress test and cardiac catheterization by Dr. Donnie Ahoilley.  This revealed severe three- vessel coronary artery disease with a very diffusely diseased right coronary artery with a poor distal vessel.  A circumflex system with a moderate size second obtuse marginal, which was totally occluded and filling via collaterals, and 80% stenosis of the LAD in a moderate-sized diagonal.  Overall, ventricular function was preserved.  Because of the patient's significant three-vessel disease and symptoms, coronary artery bypass grafting was recommended to the patient who agreed and signed informed consent.  DESCRIPTION OF PROCEDURE:  With Swan-Ganz and arterial line monitors in place, the patient underwent general  endotracheal anesthesia without incidence.  Skin of the chest and legs were prepped with Betadine and draped in usual sterile manner.  Using the [employer] endovein harvesting system, vein was harvested from the right thigh and calf and was of good quality and caliber.  Median sternotomy was performed.  Left internal mammary artery was dissected down as a pedicle graft.  The distal artery was divided, had good free flow.  The pericardium was opened and overall ventricular function appeared preserved.  This was confirmed with TEE by Dr. Katrinka BlazingSmith.  The patient was systemically heparinized.  Ascending aorta was cannulated.  The right atrium was cannulated.  An aortic root vent cardioplegic needle was introduced into the ascending aorta.  The patient was placed on cardiopulmonary bypass 2.4 L/min/m2 and sites of anastomosis were selected and dissected out of the epicardium.  The patient's body temperature was cooled to 32 degrees.  Aortic cross-clamp was applied and 500 mL cold blood potassium cardioplegia was administered with diastolic arrest of the heart.  Myocardial septal temperature was monitored throughout the cross-clamp.  Attention was turned first to the circumflex system.  The heart was elevated in the largest vessel on the lateral wall.  The second obtuse marginal was opened and admitted an 1.5 mm probe.  There was some mild distal disease in the vessel.  The more distal branches were very small.  Using a running 7-0 Prolene,  a distal anastomosis with segment of reverse saphenous vein graft was carried out.  Additional cold blood cardioplegia was administered down the vein graft.  Attention was then turned to the diagonal coronary artery, which was opened, was 1.5 mm in size and using a running 7-0 Prolene, a segment of reverse saphenous vein graft was anastomosed to the diagonal coronary artery.  The LAD was diffusely diseased in the midportion.  The vessel was opened and did admit a  1.5-mm probe distally.  Using a running 8-0 Prolene, left internal mammary artery was anastomosed to the left anterior descending coronary artery.  The edge of the heart was then elevated and the distal right coronary artery which was very diffusely diseased and severely calcified.  In the distal portion of the right coronary artery, there was an area that could be opened.  The vessel was very severely diseased and distally was very small, but did admit a 1-mm probe easily.  Using a running 7-0 Prolene, a distal anastomosis was performed.  With the crossclamp still in place, three punch aortotomies were performed and each of the 3 vein grafts were anastomosed to the ascending aorta.  Air was evacuated from the grafts and the ascending aorta, and the aorta cross-clamp was removed with total cross-clamp time of 94 minutes. Before removing the cross clamp, the bulldog on the mammary artery was removed and was prompt rise in myocardial septal temperature.  Sites of anastomosis were inspected and free of bleeding.  The patient's body temperature was rewarmed to 37 degrees.  Atrial and ventricular pacing wires were applied.  The patient was then ventilated and weaned from cardiopulmonary bypass without difficulty with a total pump time of 126 minutes.  Sites of anastomosis were free of bleeding.  The patient was decannulated in usual fashion.  Protamine was administered with operative field hemostatic.  A left chest tube and a Blake mediastinal drain were left in place.  Pericardium was loosely reapproximated. Sternum was closed with #6 stainless steel wire.  Fascia was closed with interrupted 0 Vicryl, running 3-0 Vicryl, and subcutaneous tissue, and 4- 0 subcuticular stitch in skin edges.  Dry dressings were applied. Sponge and needle count was reported as correct at completion of the procedure.  The patient tolerated the procedure without obvious complication and was transferred to Surgical  Intensive Care Unit for further postoperative care.     Sheliah Plane, MD     EG/MEDQ  D:  02/23/2014  T:  02/23/2014  Job:  [id number]  cc:   Georga Hacking, M.D.

## 2014-02-24 NOTE — Progress Notes (Signed)
Pt ambulated approximately 150 ft accompanied by wife. Pt currently resting in bed with call bell within reach. Will continue to monitor.   Chesley Miresavis, Albert Devaul R

## 2014-02-24 NOTE — Progress Notes (Signed)
Pt ambulated approximately 150 ft accompanied by wife. Pt tolerated ambulation well. Pt is currently resting in chair with call bell within reach. Will continue to monitor.  Chesley Miresavis, Corey Laski R

## 2014-02-24 NOTE — Progress Notes (Signed)
CARDIAC REHAB PHASE I   PRE:  Rate/Rhythm: SR 90  BP:  Supine:   Sitting: 108/69  Standing:    SaO2: 94% ra  MODE:  Ambulation: 250 ft   POST:  Rate/Rhythm: 102  BP:  Supine:   Sitting: 121/75  Standing:    SaO2: 95 ra  Pt oob in chair finishing a sandwich.  Pt assisted up to ambulate 250 ft x 1 assist. Pt gait slow but steady. Complained of some "gasey" pain and weak legs. Pt back to chair, denies any needs, family at bedside, call bell within reach. [phone] Karlene Linemanarlette Carlton RN, BSN

## 2014-02-25 ENCOUNTER — Inpatient Hospital Stay (HOSPITAL_COMMUNITY): Payer: BC Managed Care – PPO

## 2014-02-25 LAB — CBC
HCT: 25.7 % — ABNORMAL LOW (ref 39.0–52.0)
Hemoglobin: 8.5 g/dL — ABNORMAL LOW (ref 13.0–17.0)
MCH: 31.3 pg (ref 26.0–34.0)
MCHC: 33.1 g/dL (ref 30.0–36.0)
MCV: 94.5 fL (ref 78.0–100.0)
Platelets: 178 10*3/uL (ref 150–400)
RBC: 2.72 MIL/uL — ABNORMAL LOW (ref 4.22–5.81)
RDW: 13.2 % (ref 11.5–15.5)
WBC: 11.9 10*3/uL — ABNORMAL HIGH (ref 4.0–10.5)

## 2014-02-25 LAB — BASIC METABOLIC PANEL
Anion gap: 12 (ref 5–15)
BUN: 13 mg/dL (ref 6–23)
CO2: 28 mEq/L (ref 19–32)
Calcium: 7.9 mg/dL — ABNORMAL LOW (ref 8.4–10.5)
Chloride: 98 mEq/L (ref 96–112)
Creatinine, Ser: 0.85 mg/dL (ref 0.50–1.35)
GFR calc Af Amer: >90 mL/min (ref >90)
GFR calc non Af Amer: >90 mL/min (ref >90)
Glucose, Bld: 128 mg/dL — ABNORMAL HIGH (ref 70–99)
Potassium: 3.8 mEq/L (ref 3.7–5.3)
Sodium: 138 mEq/L (ref 137–147)

## 2014-02-25 LAB — GLUCOSE, CAPILLARY
Glucose-Capillary: 144 mg/dL — ABNORMAL HIGH (ref 70–99)
Glucose-Capillary: 163 mg/dL — ABNORMAL HIGH (ref 70–99)
Glucose-Capillary: 135 mg/dL — ABNORMAL HIGH (ref 70–99)
Glucose-Capillary: 100 mg/dL — ABNORMAL HIGH (ref 70–99)

## 2014-02-25 MED ORDER — AMIODARONE HCL 200 MG PO TABS: 200.0000 mg | ORAL_TABLET | Freq: Every day | ORAL | Status: DC

## 2014-02-25 MED ORDER — POTASSIUM CHLORIDE CRYS ER 20 MEQ PO TBCR
40.0000 meq | EXTENDED_RELEASE_TABLET | Freq: Once | ORAL | Status: AC
  Administered 2014-02-25: 40 meq via ORAL
  Filled 2014-02-25: qty 2

## 2014-02-25 MED ORDER — AMIODARONE LOAD VIA INFUSION
150.0000 mg | Freq: Once | INTRAVENOUS | Status: AC
  Administered 2014-02-25: 150 mg via INTRAVENOUS
  Filled 2014-02-25: qty 83.34

## 2014-02-25 MED ORDER — AMIODARONE HCL 200 MG PO TABS
200.0000 mg | ORAL_TABLET | Freq: Two times a day (BID) | ORAL | Status: AC
  Administered 2014-02-25 (×2): 200 mg via ORAL
  Filled 2014-02-25 (×2): qty 1

## 2014-02-25 MED ORDER — METOPROLOL TARTRATE 25 MG PO TABS
25.0000 mg | ORAL_TABLET | Freq: Two times a day (BID) | ORAL | Status: DC
  Administered 2014-02-25 – 2014-02-27 (×4): 25 mg via ORAL
  Filled 2014-02-25 (×6): qty 1

## 2014-02-25 MED ORDER — AMIODARONE HCL IN DEXTROSE 360-4.14 MG/200ML-% IV SOLN
60.0000 mg/h | INTRAVENOUS | Status: AC
  Administered 2014-02-25 (×2): 60 mg/h via INTRAVENOUS
  Filled 2014-02-25 (×2): qty 200

## 2014-02-25 MED ORDER — AMIODARONE HCL 200 MG PO TABS
200.0000 mg | ORAL_TABLET | Freq: Two times a day (BID) | ORAL | Status: DC
  Administered 2014-02-26 – 2014-02-27 (×3): 200 mg via ORAL
  Filled 2014-02-25 (×5): qty 1

## 2014-02-25 MED ORDER — AMIODARONE HCL IN DEXTROSE 360-4.14 MG/200ML-% IV SOLN
30.0000 mg/h | INTRAVENOUS | Status: DC
  Administered 2014-02-25: 30 mg/h via INTRAVENOUS
  Filled 2014-02-25 (×8): qty 200

## 2014-02-25 MED ORDER — ENOXAPARIN SODIUM 40 MG/0.4ML ~~LOC~~ SOLN
40.0000 mg | SUBCUTANEOUS | Status: DC
Start: 2014-02-25 — End: 2014-02-27
  Administered 2014-02-25: 40 mg via SUBCUTANEOUS
  Filled 2014-02-25 (×3): qty 0.4

## 2014-02-25 MED ORDER — AMIODARONE IV BOLUS ONLY 150 MG/100ML
150.0000 mg | Freq: Once | INTRAVENOUS | Status: DC
  Filled 2014-02-25: qty 100

## 2014-02-25 NOTE — Progress Notes (Addendum)
[address]       [place] [id number]             [phone]      3 Days Post-Op Procedure(s) (LRB): CORONARY ARTERY BYPASS GRAFTING (CABG) x4 using left internal mammary artery and right greater saphenous vein. LIMA-LAD; SVG-OM; SVG-RCA; SVG-DIAG  (N/A) INTRAOPERATIVE TRANSESOPHAGEAL ECHOCARDIOGRAM (N/A) ENDOVEIN HARVEST OF GREATER SAPHENOUS VEIN (Right) Subjective: Feels ok  Objective: Vital signs in last 24 hours: Temp:  [98.4 F (36.9 C)-99.3 F (37.4 C)] 99.3 F (37.4 C) (07/24 0427) Pulse Rate:  [80-146] 146 (07/24 0623) Cardiac Rhythm:  [-] Sinus tachycardia (07/24 0734) Resp:  [18-27] 18 (07/24 0427) BP: (95-122)/(63-70) 95/70 mmHg (07/24 0623) SpO2:  [95 %-100 %] 100 % (07/24 0427) Weight:  [196 lb 3.4 oz (89 kg)] 196 lb 3.4 oz (89 kg) (07/24 0427)  Hemodynamic parameters for last 24 hours:    Intake/Output from previous day: 07/23 0701 - 07/24 0700 In: 260 [P.O.:240; I.V.:20] Out: 1000 [Urine:825; Stool:175] Intake/Output this shift:    General appearance: alert, cooperative and no distress Heart: regular rate and rhythm and tachy Lungs: clear to auscultation bilaterally Abdomen: benign Extremities: minor edema Wound: incis healing well  Lab Results:  Recent Labs  02/24/14 0415 02/25/14 0424  WBC 14.9* 11.9*  HGB 9.9* 8.5*  HCT 29.8* 25.7*  PLT 166 178   BMET:  Recent Labs  02/24/14 0415 02/25/14 0424  NA 135* 138  K 4.0 3.8  CL 100 98  CO2 26 28  GLUCOSE 132* 128*  BUN 16 13  CREATININE 0.90 0.85  CALCIUM 8.1* 7.9*    PT/INR:  Recent Labs  02/22/14 1340  LABPROT 18.8*  INR 1.57*   ABG    Component Value Date/Time   PHART 7.343* 02/22/2014 2209   HCO3 25.1* 02/22/2014 2209   TCO2 25 02/23/2014 1741   ACIDBASEDEF 1.0 02/22/2014 2209   O2SAT 98.0 02/22/2014 2209   CBG (last 3)   Recent Labs  02/24/14 1616 02/24/14 2053 02/25/14 0613  GLUCAP 155* 147* 144*   Scheduled Meds: . aspirin EC  325 mg  Oral Daily  . atorvastatin  20 mg Oral q1800  . docusate sodium  200 mg Oral Daily  . insulin aspart  0-24 Units Subcutaneous TID AC & HS  . metoprolol tartrate  12.5 mg Oral BID  . pantoprazole  40 mg Oral QAC breakfast  . sodium chloride  3 mL Intravenous Q12H   Continuous Infusions: . amiodarone 60 mg/hr (02/25/14 0656)   Followed by  . amiodarone     PRN Meds:.sodium chloride, ALPRAZolam, bisacodyl, bisacodyl, diphenhydrAMINE, ketorolac, ondansetron (ZOFRAN) IV, ondansetron, oxyCODONE, sodium chloride, traMADol  Dg Chest 2 View  02/25/2014   CLINICAL DATA:  63 year old male chest pain. Recent CABG. Initial encounter.  EXAM: CHEST  2 VIEW  COMPARISON:  02/24/2014 and earlier.  FINDINGS: Right IJ introducer sheath removed. Stable cardiac size and mediastinal contours. Visualized tracheal air column is within normal limits. Sequelae of CABG. No pneumothorax. Small to moderate bilateral pleural effusions with infrahilar and bibasilar opacity compatible with atelectasis. No pulmonary edema. Epicardial pacer wires remain in place. No acute osseous abnormality identified.  IMPRESSION: Lines and tubes removed. Small to moderate bilateral pleural effusions with atelectasis.   Electronically Signed   By: Augusto GambleLee  Hall M.D.   On: 02/25/2014 07:41   Dg Chest Port 1 View  02/24/2014   CLINICAL DATA:  Chest tube removal  EXAM: PORTABLE  CHEST - 1 VIEW  COMPARISON:  02/23/2014  FINDINGS: Left chest tube removed.  No pneumothorax.  Swan-Ganz catheter removed. Right jugular sheath in the right innominate vein.  Left lower lobe atelectasis shows mild interval improvement. Negative for edema.  IMPRESSION: Negative for pneumothorax post left chest tube removal. Mild improvement in left lower lobe atelectasis.   Electronically Signed   By: Marlan Palau M.D.   On: 02/24/2014 07:48    Assessment/Plan: S/P Procedure(s) (LRB): CORONARY ARTERY BYPASS GRAFTING (CABG) x4 using left internal mammary artery and right  greater saphenous vein. LIMA-LAD; SVG-OM; SVG-RCA; SVG-DIAG  (N/A) INTRAOPERATIVE TRANSESOPHAGEAL ECHOCARDIOGRAM (N/A) ENDOVEIN HARVEST OF GREATER SAPHENOUS VEIN (Right)  1 hemodyn stable except did have SVT to 140's, currently on amio gtt. May need to consider cardizem instead. Will increase metoprolol to 25 BID 2 cont to monitor moderate ABL anemia 3 blood sugars ok control  4 will give a little K+ to get >4.0, recheck magnesium and check a TSH    LOS: 3 days    Douglas,Ryan E 02/25/2014  Now in a flutter, rapid a paced back to sinus rhythym, will cont betablocker and convert to po cordarone I have seen and examined Ryan Douglas and agree with the above assessment  and plan.  Delight Ovens MD Beeper [phone] Office [phone] 02/25/2014 11:59 AM

## 2014-02-25 NOTE — Progress Notes (Signed)
  Amiodarone Drug - Drug Interaction Consult Note  Recommendations: monitor HR while on lopressor and QTc while on zofran with amiodarone.  Amiodarone is metabolized by the cytochrome P450 system and therefore has the potential to cause many drug interactions. Amiodarone has an average plasma half-life of 50 days (range 20 to 100 days).   There is potential for drug interactions to occur several weeks or months after stopping treatment and the onset of drug interactions may be slow after initiating amiodarone.   []  Statins: Increased risk of myopathy. Simvastatin- restrict dose to 20mg  daily. Other statins: counsel patients to report any muscle pain or weakness immediately.  []  Anticoagulants: Amiodarone can increase anticoagulant effect. Consider warfarin dose reduction. Patients should be monitored closely and the dose of anticoagulant altered accordingly, remembering that amiodarone levels take several weeks to stabilize.  []  Antiepileptics: Amiodarone can increase plasma concentration of phenytoin, the dose should be reduced. Note that small changes in phenytoin dose can result in large changes in levels. Monitor patient and counsel on signs of toxicity.  [x]  Beta blockers: increased risk of bradycardia, AV block and myocardial depression. Sotalol - avoid concomitant use.  []   Calcium channel blockers (diltiazem and verapamil): increased risk of bradycardia, AV block and myocardial depression.  []   Cyclosporine: Amiodarone increases levels of cyclosporine. Reduced dose of cyclosporine is recommended.  []  Digoxin dose should be halved when amiodarone is started.  []  Diuretics: increased risk of cardiotoxicity if hypokalemia occurs.  []  Oral hypoglycemic agents (glyburide, glipizide, glimepiride): increased risk of hypoglycemia. Patient's glucose levels should be monitored closely when initiating amiodarone therapy.   [x]  Drugs that prolong the QT interval:  Torsades de pointes risk may  be increased with concurrent use - avoid if possible.  Monitor QTc, also keep magnesium/potassium WNL if concurrent therapy can't be avoided. Marland Kitchen. Antibiotics: e.g. fluoroquinolones, erythromycin. . Antiarrhythmics: e.g. quinidine, procainamide, disopyramide, sotalol. . Antipsychotics: e.g. phenothiazines, haloperidol.  . Lithium, tricyclic antidepressants, and methadone.             --- Patient's on zofran PRN           Thank Zollie BeckersYou,   [name] P  02/25/2014 1:15 PM

## 2014-02-25 NOTE — Progress Notes (Signed)
CARDIAC REHAB PHASE I   PRE:  Rate/Rhythm: 80 SR  BP:  Supine:   Sitting: 110/70  Standing:    SaO2: 96 RA  MODE:  Ambulation: 350 ft   POST:  Rate/Rhythm: 97 SR  BP:  Supine:   Sitting: 106/60  Standing:    SaO2: 97 RA 1415-1435 Assisted X 1 and used walker to ambulate. Pt groggy from pain medication, so used walker. He was able to walk 350 feet without c/o. Pt to bed after walk with call light in reach and family present. HR 80-90's remained SR.  Melina CopaLisa Harith Mccadden RN 02/25/2014 2:34 PM

## 2014-02-25 NOTE — Discharge Summary (Signed)
Physician Discharge Summary  Patient ID: Ryan Douglas MRN: [id number] DOB/AGE: 01/27/1951 63 y.o.  Admit date: 02/22/2014 Discharge date: 02/27/2014  Admission Diagnoses: Symptomatic Angina pectoris with positive ETT   HISTORY OF PRESENT ILLNESS  This very nice 63 year old black male is seen at the request of Dr. Modesto Charon for evaluation of chest pain. The patient has previously been in good health except for a history of hyperlipidemia and a history of diverticulosis. He has no prior cardiac history but presents with a two-month history of progressive exertional shortness of breath and midsternal tightness with radiation to the jaw. The patient formally was able to jog and was able to go on a 4 mile walk with his dog this but starting this winter began to have cold-induced exertional chest tightness. The past 2 months his tightness and shortness of breath has become progressive and typically will be relieved with rest and will recur shortly after resumption of exertion. He has had no symptoms at rest. His parents lived to be an old age. He does have untreated hyperlipidemia but does not have any history of hypertension or diabetes. He quit smoking over 20 years ago and smoked only briefly. He has had to curtail his activities because of the exertional chest discomfort. He has a somewhat sedentary job. He denies PND, orthopnea, syncope, or claudication.  He exercised on a treadmill today and walked only 3:37 minutes and had 2-3 mm of ST segment depression in the anterolateral leads that became associated with T-wave inversions. He had typical angina. He was started on treatment with metoprolol 25 mg extended release, Crestor 10 mg daily and continued on aspirin. It was recommended that he undergo cardiac catheterization and he is agreeable. He was admitted for the procedure.  Past Medical History   Diagnosis  Date   .  CAD (coronary artery disease), native coronary artery  02/17/2014     02/17/14 severe 3  VD at cath with normal LV Early positive TMT   .  Shortness of breath      with exertion   .  Wears glasses    .  Diverticulitis    .  Hyperlipidemia     Past Surgical History: no surgery, history of left arm fracture  Family History  Mother, died 54's with breast cancer  Father stroke 58 lived into 47's  One brother expired unknown cause  History    Social History   .  Marital Status:  Married     Spouse Name:  N/A     Number of Children:  N/A   .  Years of Education:  N/A    Occupational History   .  Radio producer    Social History Main Topics   .  Smoking status:  None smoker   .  Smokeless tobacco:  none   .  Alcohol Use:  none   .  Drug Use:  none    History   Smoking status   .  Former Smoker   Smokeless tobacco   .  Never Used     Comment: Quit smoking cigarettes in 1994    History   Alcohol Use  No    No Known Allergies    Discharge Diagnoses:  Active Problems:   CAD (coronary artery disease), native coronary artery   S/P CABG x 4 Atrial flutter  Discharged Condition: good   Hospital Course: the patient was admitted for elective cardiac catheterization. Due to the findings cardiothoracic surgical consultation was obtained with  Sheliah PlaneEdward Gerhardt M.D. After evaluating the patient and his studies he recommended coronary artery bypass grafting.  Postoperative hospital course:the patient has done well. He was extubated without difficulty. His remained neurologically intact. All routine lines, monitors and drainage devices have been discontinued in the standard fashion. He has had an SVT and has been started on intravenous amiodarone with plans to attempt to override pace. He does have an expected acute blood loss anemia. Incisions are healing well with out evidence of infection. He is tolerating gradually increasing activities using standard protocols. His overall status is felt to be tentatively stable for discharge in the next 48-72 hours pending ongoing  reevaluation of his recovery and in particular his rhythm status.    Consults: cardiology and cardiothoracic surgery  Significant Diagnostic Studies: cardiac catheterization Cardiac Catheterization Report  Ryan Douglas 63 y.o. male  DOB: 02/15/1951  MRN: [id number]  02/17/2014  PROCEDURE: Left heart catheterization with selective coronary angiography, left ventriculogram.  INDICATIONS: Progressive angina pectoris, strongly positive exercise treadmill test at low workload  The risks, benefits, and details of the procedure were explained to the patient. The patient verbalized understanding and wanted to proceed. Informed written consent was obtained.  PROCEDURE TECHNIQUE: After Xylocaine anesthesia a 63F sheath was placed in the right femoral artery with a single anterior needle wall stick. Left coronary angiography was done using a Judkins L4 guide catheter. Right coronary angiography was done using a Judkins R4 guide catheter. A 30 cc ventriculogram was performed in the 30 degree RAO projection. Tolerated the procedure well. Sheath removed in the holding area.  CONTRAST: Total of 70 cc.  COMPLICATIONS: None.  HEMODYNAMICS: Aortic postcontrast 147/87, left ventricle postcontrast 147/7-22. There was no gradient between the left ventricle and aorta.  ANGIOGRAPHIC DATA:  CORONARY ARTERIES: Arise and distribute normally. Right dominant. Moderate coronary calcification is noted.  Left main coronary artery: Mild irregularity.  Left anterior descending: Severe proximal 90-95% severe segmental stenosis, 70% stenosis at the margin of the diagonal branch. 95% stenosis at the apex. The LAD wraps around and supplies the distal inferior wall. Collateral filling is seen of the circumflex and the distal right coronary artery  Circumflex coronary artery,: Severe proximal stenosis and is then totally occluded. Several marginal branches arise, large third marginal and a posterior lateral branch arise after a  section of total occlusion and fills by collaterals from the left coronary and distal right coronary artery  Right coronary artery: Severe 99% segmental proximal stenosis, distal 70-80% stenosis. The vessel supplies collaterals to septal perforator is well as the circumflex area and collaterals to the distal posterior descending artery arise from the left coronary system.  LEFT VENTRICULOGRAM: Performed in the 30 RAO projection. The aortic and mitral valves are normal. The left ventricle is normal in size with normal wall motion. Estimated ejection fraction is 60%.  IMPRESSIONS:  1. Severe three-vessel coronary artery disease  2. Normal left ventricle.Marland Kitchen. RECOMMENDATION: referral for coronary artery bypass grafting. The stenoses do not appear amenable for percutaneous intervention.  Darden PalmerW. Spencer Tilley, Jr. MD [facility]  DATE OF PROCEDURE: 02/22/2014  DATE OF DISCHARGE:  OPERATIVE REPORT  PREOPERATIVE DIAGNOSIS: Unstable angina.  POSTOPERATIVE DIAGNOSIS: Unstable angina.  SURGICAL PROCEDURE: Coronary artery bypass grafting x4 with the left  internal mammary to the left anterior descending coronary artery,  reverse saphenous vein graft to the diagonal coronary artery, reverse  saphenous vein graft to the second obtuse marginal coronary artery,  reverse saphenous vein graft to the distal right coronary artery with  right thigh and calf endovein harvesting.  SURGEON: Sheliah Plane, MD  FIRST ASSISTANT: Rowe Clack, PA-C.   Activity: 1.May walk up steps                2.No lifting more than ten pounds for four weeks.                 3.No driving for four weeks.                4.Stop any activity that causes chest pain, shortness of breath, dizziness,                            sweating or excessive weakness.                5.Avoid straining.                6.Continue with your breathing exercises daily.     The patient has been discharged on:   1.Beta Blocker:  Yes [name]   ]                               No   [   ]                              If No, reason:  2.Ace Inhibitor/ARB: Yes [   ]                                     No  [ n   ]                                     If No, reason:low blood pressure, it may be able to start as outpatient  3.Statin:   Yes [  y ]                  No  [   ]                  If No, reason:  4.Ecasa:  Yes  [name]   ]                  No   [   ]                  If No, reason:   Wound Care: May shower.  Clean wounds with mild soap and water daily. Contact the office at [phone] if any problems arise.  Treatments: overide pacing for SVT   Discharge Exam: Blood pressure 96/60, pulse 139, temperature 99.3 F (37.4 C), temperature source Oral, resp. rate 18, height 5\' 11"  (1.803 m), weight 196 lb 3.4 oz (89 kg), SpO2 100.00%. General appearance: alert, cooperative and no distress Resp: clear to auscultation bilaterally Cardio: regular rate and rhythm, S1, S2 normal, no murmur, click, rub or gallop Extremities: no edema, redness or tenderness in the calves or thighs   Disposition: 01-Home or Self Care   Medications at time of discharge:   Medication List    STOP taking these medications       metoprolol succinate 25 MG 24 hr tablet  Commonly  known as:  TOPROL-XL      TAKE these medications       amiodarone 200 MG tablet  Commonly known as:  PACERONE  Take 1 tablet (200 mg total) by mouth every 12 (twelve) hours. X 7 days, then decrease to 200 mg po daily     aspirin 325 MG EC tablet  Take 1 tablet (325 mg total) by mouth daily.     metoprolol tartrate 25 MG tablet  Commonly known as:  LOPRESSOR  Take 1 tablet (25 mg total) by mouth 2 (two) times daily.     multivitamin with minerals Tabs tablet  Take 1 tablet by mouth daily.     rosuvastatin 10 MG tablet  Commonly known as:  CRESTOR  Take 10 mg by mouth daily.     traMADol 50 MG tablet  Commonly known as:  ULTRAM  Take 1-2 tablets (50-100 mg total) by mouth  every 4 (four) hours as needed for moderate pain.         Follow up: Follow-up Information   Follow up with GERHARDT,EDWARD B, MD. (August 27 at 4:30 pm to see Dr. Tyrone Sage. Please obtain a chest x-ray at [facility] imaging at 3:30 PM. [facility] imaging is located in the same office complex.)    Specialty:  Cardiothoracic Surgery   Contact information:   [address] [place] [address] [phone]       Follow up with Darden Palmer, MD Today. (Please call office to arrange a cardiology followup appointment in 2 weeks.)    Specialty:  Cardiology   Contact information:   [address] [place] [address] [phone]          Signed: Rowe Clack 02/25/2014, 10:30 AM

## 2014-02-25 NOTE — Progress Notes (Signed)
Pt sustained SVT in 140's. RN paged MD. Order given for Amio IV bolus and infusion. RN gave amio bolus and infusion. Pt currently resting in bed with call bell within reach. Will continue to monitor.  Chesley Miresavis, Gargi Berch R

## 2014-02-26 LAB — GLUCOSE, CAPILLARY
Glucose-Capillary: 105 mg/dL — ABNORMAL HIGH (ref 70–99)
Glucose-Capillary: 127 mg/dL — ABNORMAL HIGH (ref 70–99)
Glucose-Capillary: 156 mg/dL — ABNORMAL HIGH (ref 70–99)
Glucose-Capillary: 103 mg/dL — ABNORMAL HIGH (ref 70–99)

## 2014-02-26 LAB — CBC
HCT: 24.4 % — ABNORMAL LOW (ref 39.0–52.0)
Hemoglobin: 8.2 g/dL — ABNORMAL LOW (ref 13.0–17.0)
MCH: 31.8 pg (ref 26.0–34.0)
MCHC: 33.6 g/dL (ref 30.0–36.0)
MCV: 94.6 fL (ref 78.0–100.0)
Platelets: 224 10*3/uL (ref 150–400)
RBC: 2.58 MIL/uL — ABNORMAL LOW (ref 4.22–5.81)
RDW: 13.3 % (ref 11.5–15.5)
WBC: 10.7 10*3/uL — ABNORMAL HIGH (ref 4.0–10.5)

## 2014-02-26 LAB — BASIC METABOLIC PANEL
Anion gap: 10 (ref 5–15)
BUN: 13 mg/dL (ref 6–23)
CO2: 28 mEq/L (ref 19–32)
Calcium: 8.1 mg/dL — ABNORMAL LOW (ref 8.4–10.5)
Chloride: 98 mEq/L (ref 96–112)
Creatinine, Ser: 0.96 mg/dL (ref 0.50–1.35)
GFR calc Af Amer: >90 mL/min (ref >90)
GFR calc non Af Amer: 86 mL/min — ABNORMAL LOW (ref >90)
Glucose, Bld: 118 mg/dL — ABNORMAL HIGH (ref 70–99)
Potassium: 4.4 mEq/L (ref 3.7–5.3)
Sodium: 136 mEq/L — ABNORMAL LOW (ref 137–147)

## 2014-02-26 LAB — TSH: TSH: 2.7 u[IU]/mL (ref 0.350–4.500)

## 2014-02-26 LAB — MAGNESIUM: Magnesium: 2.1 mg/dL (ref 1.5–2.5)

## 2014-02-26 MED ORDER — MAGNESIUM HYDROXIDE 400 MG/5ML PO SUSP: 30.0000 mL | Freq: Every day | ORAL | Status: DC | PRN

## 2014-02-26 MED ORDER — POLYETHYLENE GLYCOL 3350 17 G PO PACK
17.0000 g | PACK | Freq: Every day | ORAL | Status: DC
  Administered 2014-02-26 – 2014-02-27 (×2): 17 g via ORAL
  Filled 2014-02-26 (×2): qty 1

## 2014-02-26 NOTE — Progress Notes (Signed)
Cardiac Rehab Phase I  Pt has been ambulating independently in hall and in room.  Reviewed discharge education with pt.  Discussed diet, exercise, restrictions, sternal precautions, ISP use, and when to call 911/MD.  Also discussed Cardiac Rehab Phase I and pt gave permission to have info sent to [place].  Pt voiced understanding. Fabio PierceJessica Antonious Omahoney, MA, ACSM RCEP [phone]

## 2014-02-26 NOTE — Progress Notes (Signed)
Pt ambulated in hallway 500 ft on room air and tolerated activity well. Heart rate remained in low 100's. Will continue to monitor.

## 2014-02-26 NOTE — Progress Notes (Addendum)
       [address]       [place], [id number]             [phone]          4 Days Post-Op Procedure(s) (LRB): CORONARY ARTERY BYPASS GRAFTING (CABG) x4 using left internal mammary artery and right greater saphenous vein. LIMA-LAD; SVG-OM; SVG-RCA; SVG-DIAG  (N/A) INTRAOPERATIVE TRANSESOPHAGEAL ECHOCARDIOGRAM (N/A) ENDOVEIN HARVEST OF GREATER SAPHENOUS VEIN (Right)  Subjective: Ambulating in hall, no complaints except constipation.   Objective: Vital signs in last 24 hours: Patient Vitals for the past 24 hrs:  BP Temp Temp src Pulse Resp SpO2 Weight  02/26/14 0403 99/60 mmHg 98.4 F (36.9 C) Oral 89 18 93 % 199 lb 15.3 oz (90.7 kg)  02/25/14 2008 107/64 mmHg 98.6 F (37 C) Oral 89 18 97 % -  02/25/14 1443 102/67 mmHg 97.5 F (36.4 C) Oral 81 17 95 % -  02/25/14 1205 - - - 80 - - -  02/25/14 0952 96/60 mmHg - - 139 - - -   Current Weight  02/26/14 199 lb 15.3 oz (90.7 kg)  PRE-OPERATIVE WEIGHT: 85kg    Intake/Output from previous day: 07/24 0701 - 07/25 0700 In: 504 [P.O.:504] Out: 950 [Urine:950]  CBGs 135-100-118   PHYSICAL EXAM:  Heart: RRR Lungs: Clear Wound: Clean and dry Extremities: Mild RLE edema    Lab Results: CBC: Recent Labs  02/25/14 0424 02/26/14 0352  WBC 11.9* 10.7*  HGB 8.5* 8.2*  HCT 25.7* 24.4*  PLT 178 224   BMET:  Recent Labs  02/25/14 0424 02/26/14 0352  NA 138 136*  K 3.8 4.4  CL 98 98  CO2 28 28  GLUCOSE 128* 118*  BUN 13 13  CREATININE 0.85 0.96  CALCIUM 7.9* 8.1*    PT/INR: No results found for this basename: LABPROT, INR,  in the last 72 hours    Assessment/Plan: S/P Procedure(s) (LRB): CORONARY ARTERY BYPASS GRAFTING (CABG) x4 using left internal mammary artery and right greater saphenous vein. LIMA-LAD; SVG-OM; SVG-RCA; SVG-DIAG  (N/A) INTRAOPERATIVE TRANSESOPHAGEAL ECHOCARDIOGRAM (N/A) ENDOVEIN HARVEST OF GREATER SAPHENOUS VEIN (Right)  CV- postop atrial flutter, now back in SR  after RAP yesterday.  Continue po Amio, Lopressor. Watch BPs as they are low normal.  Roll and tape EPWs today, hopefully can d/c EPWs soon if rhythm remains stable.  Expected postop blood loss anemia- H/H generally stable.  GI- Pt takes Miralax at home, will resume and order LOC prn.  CRPI, pulm toilet.  Home 1-2 days if rhythm remains stable.   LOS: 4 days    COLLINS,GINA H 02/26/2014  I have seen and examined the patient and agree with the assessment and plan as outlined.  No further episodes Afib/Aflutter.  Olia Hinderliter H 02/26/2014 11:50 AM

## 2014-02-26 NOTE — Progress Notes (Signed)
Pt ambulated in hallway 500 ft with son on room air and tolerated activity well. Will continue to monitor.

## 2014-02-27 LAB — GLUCOSE, CAPILLARY
Glucose-Capillary: 110 mg/dL — ABNORMAL HIGH (ref 70–99)
Glucose-Capillary: 90 mg/dL (ref 70–99)

## 2014-02-27 MED ORDER — ASPIRIN 325 MG PO TBEC: 325.0000 mg | DELAYED_RELEASE_TABLET | Freq: Every day | ORAL | Status: DC

## 2014-02-27 MED ORDER — AMIODARONE HCL 200 MG PO TABS: 200.0000 mg | ORAL_TABLET | Freq: Two times a day (BID) | ORAL | Status: DC

## 2014-02-27 MED ORDER — METOPROLOL TARTRATE 25 MG PO TABS: 25.0000 mg | ORAL_TABLET | Freq: Two times a day (BID) | ORAL | Status: AC

## 2014-02-27 MED ORDER — TRAMADOL HCL 50 MG PO TABS: 50.0000 mg | ORAL_TABLET | ORAL | Status: DC | PRN

## 2014-02-27 NOTE — Discharge Instructions (Signed)
Endoscopic Saphenous Vein Harvesting °Care After °Refer to this sheet in the next few weeks. These instructions provide you with information on caring for yourself after your procedure. Your health care provider may also give you more specific instructions. Your treatment has been planned according to current medical practices, but problems sometimes occur. Call your health care provider if you have any problems or questions after your procedure. °HOME CARE INSTRUCTIONS °Medicine °· Take whatever pain medicine your surgeon prescribes. Follow the directions carefully. Do not take over-the-counter pain medicine unless your surgeon says it is okay. Some pain medicine can cause bleeding problems for several weeks after surgery. °· Follow your surgeon's instructions about driving. You will probably not be permitted to drive after heart surgery. °· Take any medicines your surgeon prescribes. Any medicines you took before your heart surgery should be checked with your health care provider before you start taking them again. °Wound care °· If your surgeon has prescribed an elastic bandage or stocking, ask how long you should wear it. °· Check the area around your surgical cuts (incisions) whenever your bandages (dressings) are changed. Look for any redness or swelling. °· You will need to return to have the stitches (sutures) or staples taken out. Ask your surgeon when to do that. °· Ask your surgeon when you can shower or bathe. °Activity °· Try to keep your legs raised when you are sitting. °· Do any exercises your health care providers have given you. These may include deep breathing exercises, coughing, walking, or other exercises. °SEEK MEDICAL CARE IF: °· You have any questions about your medicines. °· You have more leg pain, especially if your pain medicine stops working. °· New or growing bruises develop on your leg. °· Your leg swells, feels tight, or becomes red. °· You have numbness in your leg. °SEEK IMMEDIATE  MEDICAL CARE IF: °· Your pain gets much worse. °· Blood or fluid leaks from any of the incisions. °· Your incisions become warm, swollen, or red. °· You have chest pain. °· You have trouble breathing. °· You have a fever. °· You have more pain near your leg incision. °MAKE SURE YOU: °· Understand these instructions. °· Will watch your condition. °· Will get help right away if you are not doing well or get worse. °Document Released: 04/03/2011 Document Revised: 07/27/2013 Document Reviewed: 04/03/2011 °ExitCare® Patient Information ©2015 [employer]. This information is not intended to replace advice given to you by your health care provider. Make sure you discuss any questions you have with your health care provider. °Coronary Artery Bypass Grafting, Care After °These instructions give you information on caring for yourself after your procedure. Your doctor may also give you more specific instructions. Call your doctor if you have any problems or questions after your procedure.  °HOME CARE °· Only take medicine as told by your doctor. Take medicines exactly as told. Do not stop taking medicines or start any new medicines without talking to your doctor first. °· Take your pulse as told by your doctor. °· Do deep breathing as told by your doctor. Use your breathing device (incentive spirometer), if given, to practice deep breathing several times a day. Support your chest with a pillow or your arms when you take deep breaths or cough. °· Keep the area clean, dry, and protected where the surgery cuts (incisions) were made. Remove bandages (dressings) only as told by your doctor. If strips were applied to surgical area, do not take them off. They fall off   on their own. °· Check the surgery area daily for puffiness (swelling), redness, or leaking fluid. °· If surgery cuts were made in your legs: °· Avoid crossing your legs. °· Avoid sitting for long periods of time. Change positions every 30 minutes. °· Raise your legs  when you are sitting. Place them on pillows. °· Wear stockings that help keep blood clots from forming in your legs (compression stockings). °· Only take sponge baths until your doctor says it is okay to take showers. Pat the surgery area dry. Do not rub the surgery area with a washcloth or towel. Do not bathe, swim, or use a hot tub until your doctor says it is okay. °· Eat foods that are high in fiber. These include raw fruits and vegetables, whole grains, beans, and nuts. Choose lean meats. Avoid canned, processed, and fried foods. °· Drink enough fluids to keep your pee (urine) clear or pale yellow. °· Weigh yourself every day. °· Rest and limit activity as told by your doctor. You may be told to: °· Stop any activity if you have chest pain, shortness of breath, changes in heartbeat, or dizziness. Get help right away if this happens. °· Move around often for short amounts of time or take short walks as told by your doctor. Gradually become more active. You may need help to strengthen your muscles and build endurance. °· Avoid lifting, pushing, or pulling anything heavier than 10 pounds (4.5 kg) for at least 6 weeks after surgery. °· Do not drive until your doctor says it is okay. °· Ask your doctor when you can go back to work. °· Ask your doctor when you can begin sexual activity again. °· Follow up with your doctor as told. °GET HELP IF: °· You have puffiness, redness, more pain, or fluid draining from the incision site. °· You have a fever. °· You have puffiness in your ankles or legs. °· You have pain in your legs. °· You gain 2 or more pounds (0.9 kg) a day. °· You feel sick to your stomach (nauseous) or throw up (vomit). °· You have watery poop (diarrhea). °GET HELP RIGHT AWAY IF: °· You have chest pain that goes to your jaw or arms. °· You have shortness of breath. °· You have a fast or irregular heartbeat. °· You notice a "clicking" in your breastbone when you move. °· You have numbness or weakness in  your arms or legs. °· You feel dizzy or light-headed. °MAKE SURE YOU: °· Understand these instructions. °· Will watch your condition. °· Will get help right away if you are not doing well or get worse. °Document Released: 07/27/2013 Document Reviewed: 07/27/2013 °ExitCare® Patient Information ©2015 [employer]. This information is not intended to replace advice given to you by your health care provider. Make sure you discuss any questions you have with your health care provider. ° °

## 2014-02-27 NOTE — Progress Notes (Signed)
EPW removed per order. Ends intact. VSS. Pt instructed of one hour bedrest. Verbalized understanding. CT sutures removed per order. Benzoin and 1/2" Steri strips applied. Pt instructed to leave the Steris on until they fall off on their own.  Verbalized understanding.  Will continue to monitor pt cloesly.

## 2014-02-27 NOTE — Progress Notes (Addendum)
       [address]       [place] [id number]             [phone]          5 Days Post-Op Procedure(s) (LRB): CORONARY ARTERY BYPASS GRAFTING (CABG) x4 using left internal mammary artery and right greater saphenous vein. LIMA-LAD; SVG-OM; SVG-RCA; SVG-DIAG  (N/A) INTRAOPERATIVE TRANSESOPHAGEAL ECHOCARDIOGRAM (N/A) ENDOVEIN HARVEST OF GREATER SAPHENOUS VEIN (Right)  Subjective: Feeling well, no further arrhythmias.   Objective: Vital signs in last 24 hours: Patient Vitals for the past 24 hrs:  BP Temp Temp src Pulse Resp SpO2 Weight  02/27/14 0418 96/53 mmHg 98.8 F (37.1 C) Oral 88 19 94 % 199 lb 6.4 oz (90.447 kg)  02/26/14 2021 111/61 mmHg 101 F (38.3 C) Oral 111 20 95 % -  02/26/14 0955 103/54 mmHg - - 91 18 - -   Current Weight  02/27/14 199 lb 6.4 oz (90.447 kg)   PRE-OPERATIVE WEIGHT: 85kg   Intake/Output from previous day: 07/25 0701 - 07/26 0700 In: 480 [P.O.:480] Out: 1450 [Urine:1450]    PHYSICAL EXAM:  Heart: RRR Lungs: Clear Wound: Clean and dry Extremities: Mild RLE edema    Lab Results: CBC: Recent Labs  02/25/14 0424 02/26/14 0352  WBC 11.9* 10.7*  HGB 8.5* 8.2*  HCT 25.7* 24.4*  PLT 178 224   BMET:  Recent Labs  02/25/14 0424 02/26/14 0352  NA 138 136*  K 3.8 4.4  CL 98 98  CO2 28 28  GLUCOSE 128* 118*  BUN 13 13  CREATININE 0.85 0.96  CALCIUM 7.9* 8.1*    PT/INR: No results found for this basename: LABPROT, INR,  in the last 72 hours    Assessment/Plan: S/P Procedure(s) (LRB): CORONARY ARTERY BYPASS GRAFTING (CABG) x4 using left internal mammary artery and right greater saphenous vein. LIMA-LAD; SVG-OM; SVG-RCA; SVG-DIAG  (N/A) INTRAOPERATIVE TRANSESOPHAGEAL ECHOCARDIOGRAM (N/A) ENDOVEIN HARVEST OF GREATER SAPHENOUS VEIN (Right) CV- No further arrhythmias. BPs generally stable. Plan d/c home today- instructions reviewed with patient.   LOS: 5 days    COLLINS,GINA H 02/27/2014  I have  seen and examined the patient and agree with the assessment and plan as outlined. D/C home today.  Instructions given.   Ashlay Altieri H 02/27/2014 11:32 AM

## 2014-02-28 NOTE — Progress Notes (Signed)
Assessment unchanged. Discussed D/C instructions with pt including f/u appointments and new medications. Verbalized understanding. RX given to pt. IV and tele removed. Pt left with belongings accompanied by NT.  

## 2014-03-03 ENCOUNTER — Encounter: Payer: Self-pay | Admitting: Neurology

## 2014-03-03 ENCOUNTER — Ambulatory Visit (INDEPENDENT_AMBULATORY_CARE_PROVIDER_SITE_OTHER): Payer: BC Managed Care – PPO | Admitting: Neurology

## 2014-03-03 VITALS — BP 100/56 | HR 74 | Resp 16 | Wt 199.0 lb

## 2014-03-03 DIAGNOSIS — F519 Sleep disorder not due to a substance or known physiological condition, unspecified: Secondary | ICD-10-CM

## 2014-03-03 DIAGNOSIS — I4819 Other persistent atrial fibrillation: Secondary | ICD-10-CM

## 2014-03-03 DIAGNOSIS — I4891 Unspecified atrial fibrillation: Secondary | ICD-10-CM

## 2014-03-03 DIAGNOSIS — R0683 Snoring: Secondary | ICD-10-CM

## 2014-03-03 DIAGNOSIS — F5109 Other insomnia not due to a substance or known physiological condition: Secondary | ICD-10-CM | POA: Insufficient documentation

## 2014-03-03 DIAGNOSIS — J385 Laryngeal spasm: Secondary | ICD-10-CM

## 2014-03-03 DIAGNOSIS — R0989 Other specified symptoms and signs involving the circulatory and respiratory systems: Secondary | ICD-10-CM

## 2014-03-03 DIAGNOSIS — R0609 Other forms of dyspnea: Secondary | ICD-10-CM

## 2014-03-03 DIAGNOSIS — G4700 Insomnia, unspecified: Secondary | ICD-10-CM

## 2014-03-03 HISTORY — DX: Sleep disorder not due to a substance or known physiological condition, unspecified: F51.9

## 2014-03-03 HISTORY — DX: Snoring: R06.83

## 2014-03-03 MED ORDER — ZOLPIDEM TARTRATE ER 6.25 MG PO TBCR: 6.2500 mg | EXTENDED_RELEASE_TABLET | Freq: Every evening | ORAL | Status: DC | PRN

## 2014-03-03 MED ORDER — TIZANIDINE HCL 4 MG PO TABS: 4.0000 mg | ORAL_TABLET | Freq: Every day | ORAL | Status: DC

## 2014-03-03 NOTE — Progress Notes (Signed)
[facility]  Provider:  Melvyn Novasarmen  Yanis Juma, M D  Referring Provider: Ileana LaddWong, Francis P, MD Primary Care Physician:  Redmond BasemanWONG,FRANCIS PATRICK, MD  Chief Complaint  Patient presents with  . New Evaluation    Room 11  . Sleep consult    HPI:  Ryan Douglas is a 63 y.o. male , right handed , who is status post  4 vessel Bypass surgery on the 21st. of this month . He Is seen here as a referral from Dr. [name] for sleep evaluation.  Ryan Douglas , a very slender man, and his wife are seen here today to discuss his recent sleep changes. After his heart surgery he found himself unable to tolerate the lateral sleep position, which he had resumed . He cannot tolerate the supine , due to snoring and choking. He noted diaphoresis and palpitations. He stated, that his sleep problems preceeded his surgery and he purposefully resumed a non supine sleep position. He feels better sitting up , but chokes or strangles for air if his head falls back. He uses breath right strips. He finds just no peaceful, restorative sleep now.  His heart disease was evident when he walked his dog, 2 times a day and 4 miles twice a week. He developed chest tightness, and heart burn, took Tums, without success. Marland Kitchen.   He noticed he coughs frequently after eating solid foods, and gulps air when drinking since surgery. He feels bloated, has stomach pressure, has flatulence and GERD. He has atrial fibrillation and just started on Pacerone,  he was glucose intolerant in the hospital , but is not diabetic.    His sleep habits are as follows: he goes to bed at 11 o'clock, bedroom shared  with his wife, usually falls asleep promptly , the bedroom is cool, quiet and dark, no TV. He wakes up with  1-2 nocturias and has often sinus pressure, feeling he cannot breath through the nose. Wakes up with a dry mouth. He wakes up spontaneously at 6 AM,  He consumes one cup of coffee.    Wake time is 6 AM , spontaneously, no alarm is  needed. 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 His work day ends at 5 PM at home office and on the road at 9 PM. 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 He has trouble sleeping in hotels, he reports.    His father was a heavy smoker and had strangled frequently after eating. The patient was exposed to passive smoke all his childhood,  Smoked 30 years - started in his 7 th grade ,  From [id number] ( military  Time ) he smoked 1/2 ppd , finally quit 1 ppd habit  in 1994. [place].    Review of Systems: Out of a complete 14 system review, the patient complains of only the following symptoms, and all other reviewed systems are negative. Snoring, choking, air hunger during sleep.   History   Social History  . Marital Status: Married    Spouse Name: Yolanda    Number of Children: 2  . Years of Education: College   Occupational History  .     Social History Main Topics  . Smoking status: Former Games developermoker  . Smokeless tobacco: Never Used     Comment: Quit smoking cigarettes in 1994  . Alcohol Use: No  .  Drug Use: No  . Sexual Activity: Not on file   Other Topics Concern  . Not on file   Social History Narrative   Patient is married Theme park manager)  and lives at home with his wife and children.   Patient has one adult children.   Patient works at [employer].   Patient has a college education.   Patient is  Right-handed.   Patient drinks one cup of coffee daily.    Family History  Problem Relation Age of Onset  . Cancer Mother   . Stroke Father   . Diabetes Maternal Grandmother     Past Medical History  Diagnosis Date  . CAD (coronary artery disease), native coronary artery 02/17/2014    02/17/14 severe 3 VD at cath with normal LV  Early positive TMT   . Shortness of breath     with exertion  . Wears glasses   . Diverticulitis   . Hyperlipidemia     Past Surgical History   Procedure Laterality Date  . Root canal    . Cardiac catheterization      02/17/14  . Colonoscopy    . Coronary artery bypass graft N/A 02/22/2014    Procedure: CORONARY ARTERY BYPASS GRAFTING (CABG) x4 using left internal mammary artery and right greater saphenous vein. LIMA-LAD; SVG-OM; SVG-RCA; SVG-DIAG ;  Surgeon: Delight Ovens, MD;  Location: [facility] OR;  Service: Open Heart Surgery;  Laterality: N/A;  . Intraoperative transesophageal echocardiogram N/A 02/22/2014    Procedure: INTRAOPERATIVE TRANSESOPHAGEAL ECHOCARDIOGRAM;  Surgeon: Delight Ovens, MD;  Location: [facility] OR;  Service: Open Heart Surgery;  Laterality: N/A;  . Endovein harvest of greater saphenous vein Right 02/22/2014    Procedure: ENDOVEIN HARVEST OF GREATER SAPHENOUS VEIN;  Surgeon: Delight Ovens, MD;  Location: [facility] OR;  Service: Open Heart Surgery;  Laterality: Right;    Current Outpatient Prescriptions  Medication Sig Dispense Refill  . amiodarone (PACERONE) 200 MG tablet Take 1 tablet (200 mg total) by mouth every 12 (twelve) hours. X 7 days, then decrease to 200 mg po daily  60 tablet  1  . aspirin EC 325 MG EC tablet Take 1 tablet (325 mg total) by mouth daily.  30 tablet  0  . atorvastatin (LIPITOR) 40 MG tablet 1 tablet daily.      . metoprolol tartrate (LOPRESSOR) 25 MG tablet Take 1 tablet (25 mg total) by mouth 2 (two) times daily.  60 tablet  1  . Multiple Vitamin (MULTIVITAMIN WITH MINERALS) TABS tablet Take 1 tablet by mouth daily.      . traMADol (ULTRAM) 50 MG tablet Take 1-2 tablets (50-100 mg total) by mouth every 4 (four) hours as needed for moderate pain.  30 tablet  0   No current facility-administered medications for this visit.    Allergies as of 03/03/2014  . (No Known Allergies)    Vitals: BP 100/56  Pulse 74  Resp 16  Wt 199 lb (90.266 kg) Last Weight:  Wt Readings from Last 1 Encounters:  03/03/14 199 lb (90.266 kg)   Last Height:   Ht Readings from Last 1 Encounters:  02/22/14  5\' 11"  (1.803 m)     Physical exam:  General: The patient is awake, alert and appears not in acute distress. The patient is well groomed. Head: Normocephalic, atraumatic. Neck is supple. Mallampati  3 neck circumference:14.5. Cardiovascular:  Regular rate and rhythm, without  murmurs or carotid bruit, and without distended neck veins. Respiratory: Lungs are clear  to auscultation. Skin:  evidence of edema, bleeding, bruising in the right lower leg- venous harvest site. No  rash Trunk: BMI is normal,  and patient is slender.  Neurologic exam : The patient is awake and alert, oriented to place and time.  Memory subjective  described as intact.  There is a normal attention span & concentration ability.  Speech is fluent without dysarthria, dysphonia or aphasia. Mood and affect are appropriate.  Cranial nerves: Pupils are equal and briskly reactive to light. Funduscopic exam without evidence of pallor or edema. Extraocular movements  in vertical and horizontal planes intact and without nystagmus. Visual fields by finger perimetry are intact. Hearing to finger rub intact.  Facial sensation intact to fine touch. Facial motor strength is symmetric and tongue and uvula move midline.  Motor exam:  Normal tone and normal muscle bulk and symmetric normal strength in all extremities.  Sensory:  Fine touch, pinprick and vibration were tested in all extremities. Proprioception is tested in the upper extremities only. This was normal.  Coordination: Rapid alternating movements in the fingers/hands is tested and normal.  Finger-to-nose maneuver tested and normal without evidence of ataxia, dysmetria or tremor.  Gait and station: Patient walks without assistive device  . Strength within normal limits. Stance is stable and normal. Deep tendon reflexes: in the  upper  1 plus and the  lower extremities are attenuated, but symmetric and intact. Babinski maneuver response is downgoing.   Assessment:  After  physical and neurologic examination, review of laboratory studies, imaging, neurophysiology testing and pre-existing records, assessment is  Pain postsurgical expected. Recommend sleep aid for temporary measure , allowing to sleep lateral or prone. Snoring and air hunger - HST for screening of apnea - there is a possibility of central apnea.    Plan:  Treatment plan and additional workup :

## 2014-03-03 NOTE — Patient Instructions (Signed)
Polysomnography (Sleep Studies) Polysomnography (PSG) is a series of tests used for detecting (diagnosing) obstructive sleep apnea and other sleep disorders. The tests measure how some parts of your body are working while you are sleeping. The tests are extensive and expensive. They are done in a sleep lab or hospital, and vary from center to center. Your caregiver may perform other more simple sleep studies and questionnaires before doing more complete and involved testing. Testing may not be covered by insurance. Some of these tests are:  An EEG (Electroencephalogram). This tests your brain waves and stages of sleep.  An EOG (Electrooculogram). This measures the movements of your eyes. It detects periods of REM (rapid eye movement) sleep, which is your dream sleep.  An EKG (Electrocardiogram). This measures your heart rhythm.  EMG (Electromyography). This is a measurement of how the muscles are working in your upper airway and your legs while sleeping.  An oximetry measurement. It measures how much oxygen (air) you are getting while sleeping.  Breathing efforts may be measured. The same test can be interpreted (understood) differently by different caregivers and centers that study sleep.  Studies may be given an apnea/hypopnea index (AHI). This is a number which is found by counting the times of no breathing or under breathing during the night, and relating those numbers to the amount of time spent in bed. When the AHI is greater than 15, the patient is likely to complain of daytime sleepiness. When the AHI is greater than 30, the patient is at increased risk for heart problems and must be followed more closely. Following the AHI also allows you to know how treatment is working. Simple oximetry (tracking the amount of oxygen that is taken in) can be used for screening patients who:  Do not have symptoms (problems) of OSA.  Have a normal Epworth Sleepiness Scale Score.  Have a low pre-test  probability of having OSA.  Have none of the upper airway problems likely to cause apnea.  Oximetry is also used to determine if treatment is effective in patients who showed significant desaturations (not getting enough oxygen) on their home sleep study. One extra measure of safety is to perform additional studies for the person who only snores. This is because no one can predict with absolute certainty who will have OSA. Those who show significant desaturations (not getting enough oxygen) are recommended to have a more detailed sleep study. Document Released: 01/26/2003 Document Revised: 10/14/2011 Document Reviewed: 09/27/2013 ExitCare Patient Information 2015 [employer]. This information is not intended to replace advice given to you by your health care provider. Make sure you discuss any questions you have with your health care provider.  

## 2014-03-07 ENCOUNTER — Other Ambulatory Visit: Payer: Self-pay | Admitting: *Deleted

## 2014-03-07 ENCOUNTER — Other Ambulatory Visit: Payer: Self-pay | Admitting: Physician Assistant

## 2014-03-07 DIAGNOSIS — G8918 Other acute postprocedural pain: Secondary | ICD-10-CM

## 2014-03-07 MED ORDER — TRAMADOL HCL 50 MG PO TABS: 50.0000 mg | ORAL_TABLET | ORAL | Status: DC | PRN

## 2014-03-08 DIAGNOSIS — Z0279 Encounter for issue of other medical certificate: Secondary | ICD-10-CM

## 2014-03-09 ENCOUNTER — Telehealth: Payer: Self-pay | Admitting: Neurology

## 2014-03-09 DIAGNOSIS — R0683 Snoring: Secondary | ICD-10-CM

## 2014-03-09 NOTE — Telephone Encounter (Signed)
I called and spoke with the patient regarding his home sleep test setup.  I advised him that his insurance had actually covered him for an attended sleep study as well.  Patient says that he would prefer to have the sleep study done in the lab.  Will ask Dr. Vickey Hugerohmeier to review visit note and add order for a SPLIT night sleep study.

## 2014-03-10 ENCOUNTER — Institutional Professional Consult (permissible substitution): Payer: BC Managed Care – PPO | Admitting: Neurology

## 2014-03-28 ENCOUNTER — Ambulatory Visit: Payer: Self-pay | Admitting: Internal Medicine

## 2014-03-29 ENCOUNTER — Other Ambulatory Visit: Payer: Self-pay | Admitting: Cardiothoracic Surgery

## 2014-03-29 DIAGNOSIS — I25119 Atherosclerotic heart disease of native coronary artery with unspecified angina pectoris: Secondary | ICD-10-CM

## 2014-03-31 ENCOUNTER — Encounter: Payer: Self-pay | Admitting: Cardiothoracic Surgery

## 2014-03-31 ENCOUNTER — Ambulatory Visit
Admission: RE | Admit: 2014-03-31 | Discharge: 2014-03-31 | Disposition: A | Discharge status: Home / Self Care | Payer: BC Managed Care – PPO | Source: Ambulatory Visit | Attending: Cardiothoracic Surgery | Admitting: Cardiothoracic Surgery

## 2014-03-31 ENCOUNTER — Encounter (HOSPITAL_COMMUNITY)
Admission: RE | Admit: 2014-03-31 | Discharge: 2014-03-31 | Disposition: A | Discharge status: Home / Self Care | Payer: BC Managed Care – PPO | Source: Ambulatory Visit | Attending: Cardiology | Admitting: Cardiology

## 2014-03-31 ENCOUNTER — Ambulatory Visit (INDEPENDENT_AMBULATORY_CARE_PROVIDER_SITE_OTHER): Payer: Self-pay | Admitting: Cardiothoracic Surgery

## 2014-03-31 VITALS — BP 107/69 | HR 66 | Resp 16 | Ht 71.0 in | Wt 176.5 lb

## 2014-03-31 DIAGNOSIS — Z951 Presence of aortocoronary bypass graft: Secondary | ICD-10-CM

## 2014-03-31 DIAGNOSIS — I25119 Atherosclerotic heart disease of native coronary artery with unspecified angina pectoris: Secondary | ICD-10-CM

## 2014-03-31 NOTE — Patient Instructions (Signed)
    [address]       [place] [address]             [phone]       Coronary Artery Bypass Grafting  Care After  Refer to this sheet in the next few weeks. These instructions provide you with information on caring for yourself after your procedure. Your caregiver may also give you more specific instructions. Your treatment has been planned according to current medical practices, but problems sometimes occur. Call your caregiver if you have any problems or questions after your procedure.  Recovery from open heart surgery will be different for everyone. Some people feel well after 3 or 4 weeks, while for others it takes longer. After heart surgery, it may be normal to:  Not have an appetite, feel nauseated by the smell of food, or only want to eat a small amount.   Be constipated because of changes in your diet, activity, and medicines. Eat foods high in fiber. Add fresh fruits and vegetables to your diet. Stool softeners may be helpful.   Feel sad or unhappy. You may be frustrated or cranky. You may have good days and bad days. Do not give up. Talk to your caregiver if you do not feel better.   Feel weakness and fatigue. You many need physical therapy or cardiac rehabilitation to get your strength back.   Develop an irregular heartbeat called atrial fibrillation. Symptoms of atrial fibrillation are a fast, irregular heartbeat or feelings of fluttery heartbeats, shortness of breath, low blood pressure, and dizziness. If these symptoms develop, see your caregiver right away.  MEDICATION  Have a list of all the medicines you will be taking when you leave the hospital. For every medicine, know the following:   Name.   Exact dose.   Time of day to be taken.   How often it should be taken.   Why you are taking it.   Ask which medicines should or should not be taken together. If you take more than one heart medicine, ask if it is okay to take them together. Some  heart medicines should not be taken at the same time because they may lower your blood pressure too much.   Narcotic pain medicine can cause constipation. Eat fresh fruits and vegetables. Add fiber to your diet. Stool softener medicine may help relieve constipation.   Keep a copy of your medicines with you at all times.   Do not add or stop taking any medicine until you check with your caregiver.   Medicines can have side effects. Call your caregiver who prescribed the medicine if you:   Start throwing up, have diarrhea, or have stomach pain.   Feel dizzy or lightheaded when you stand up.   Feel your heart is skipping beats or is beating too fast or too slow.   Develop a rash.   Notice unusual bruising or bleeding.  HOME CARE INSTRUCTIONS  After heart surgery, it is important to learn how to take your pulse. Have your caregiver show you how to take your pulse.   Use your incentive spirometer. Ask your caregiver how long after surgery you need to use it.  Care of your chest incision  Tell your caregiver right away if you notice clicking in your chest (sternum).   Support your chest with a pillow or your arms when you take deep breaths and cough.   Follow your caregiver's instructions about when you can bathe or   swim.   Protect your incision from sunlight during the first year to keep the scar from getting dark.   Tell your caregiver if you notice:   Increased tenderness of your incision.   Increased redness or swelling around your incision.   Drainage or pus from your incision.  Care of your leg incision(s)  Avoid crossing your legs.   Avoid sitting for long periods of time. Change positions every half hour.   Elevate your leg(s) when you are sitting.   Check your leg(s) daily for swelling. Check the incisions for redness or drainage.   Diet is very important to heart health.   Eat plenty of fresh fruits and vegetables. Meats should be lean cut. Avoid canned,  processed, and fried foods.   Talk to a dietician. They can teach you how to make healthy food and drink choices.  Weight  Weigh yourself every day. This is important because it helps to know if you are retaining fluid that may make your heart and lungs work harder.   Use the same scale each time.   Weigh yourself every morning at the same time. You should do this after you go to the bathroom, but before you eat breakfast.   Your weight will be more accurate if you do not wear any clothes.   Record your weight.   Tell your caregiver if you have gained 2 pounds or more overnight.  Activity Stop any activity at once if you have chest pain, shortness of breath, irregular heartbeats, or dizziness. Get help right away if you have any of these symptoms.  Bathing.  Avoid soaking in a bath or hot tub until your incisions are healed.   Rest. You need a balance of rest and activity.   Exercise. Exercise per your caregiver's advice. You may need physical therapy or cardiac rehabilitation to help strengthen your muscles and build your endurance.   Climbing stairs. Unless your caregiver tells you not to climb stairs, go up stairs slowly and rest if you tire. Do not pull yourself up by the handrail.   Driving a car. Follow your caregiver's advice on when you may drive. You may ride as a passenger at any time. When traveling for long periods of time in a car, get out of the car and walk around for a few minutes every 2 hours.   Lifting. Avoid lifting, pushing, or pulling anything heavier than 10 pounds for 6 weeks after surgery or as told by your caregiver.   Returning to work. Check with your caregiver. People heal at different rates. Most people will be able to go back to work 6 to 12 weeks after surgery.   Sexual activity. You may resume sexual relations as told by your caregiver.  SEEK MEDICAL CARE IF:  Any of your incisions are red, painful, or have any type of drainage coming from them.     You have an oral temperature above 101.5 F .   You have ankle or leg swelling.   You have pain in your legs.   You have weight gain of 2 or more pounds a day.   You feel dizzy or lightheaded when you stand up.  SEEK IMMEDIATE MEDICAL CARE IF:  You have angina or chest pain that goes to your jaw or arms. Call your local emergency services right away.   You have shortness of breath at rest or with activity.   You have a fast or irregular heartbeat (arrhythmia).   There is   a "clicking" in your sternum when you move.   You have numbness or weakness in your arms or legs.  MAKE SURE YOU:  Understand these instructions.   Will watch your condition.   Will get help right away if you are not doing well or get worse.    No lifting over 25 lbs for 3 months 

## 2014-03-31 NOTE — Progress Notes (Signed)
[address]       [place] [address]             [phone]      Konstantin Lehnen [facility] Medical Record #[id number] Date of Birth: [birth year]  Referring: Othella Boyer, MD Primary Care: Redmond Baseman, MD  Chief Complaint:   POST OP FOLLOW UP 02/22/2014  OPERATIVE REPORT  PREOPERATIVE DIAGNOSIS: Unstable angina.  POSTOPERATIVE DIAGNOSIS: Unstable angina.  SURGICAL PROCEDURE: Coronary artery bypass grafting x4 with the left  internal mammary to the left anterior descending coronary artery,  reverse saphenous vein graft to the diagonal coronary artery, reverse  saphenous vein graft to the second obtuse marginal coronary artery,  reverse saphenous vein graft to the distal right coronary artery with  right thigh and calf endovein harvesting.  History of Present Illness:     Patient doing well following coronary bypass grafting approximately 4 weeks ago. He's had no recurrent angina. He is increasing his physical activity appropriately. An orientation in cardiac rehabilitation this morning.      Past Medical History  Diagnosis Date  . CAD (coronary artery disease), native coronary artery 02/17/2014    02/17/14 severe 3 VD at cath with normal LV  Early positive TMT   . Shortness of breath     with exertion  . Wears glasses   . Diverticulitis   . Hyperlipidemia   . Snoring 03/03/2014  . 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 Former Smoker  Smokeless tobacco  . Never Used    Comment: Quit smoking cigarettes in 1994    History  Alcohol Use No     Allergies  Allergen Reactions  . Morphine And Related     Headache  . Oxycodone     Headache    Current Outpatient Prescriptions  Medication Sig Dispense Refill  . aspirin EC 325 MG EC tablet Take 1 tablet (325 mg total) by mouth daily.  30 tablet  0  . atorvastatin (LIPITOR) 40 MG tablet 1 tablet daily.      . metoprolol tartrate (LOPRESSOR) 25 MG tablet  Take 1 tablet (25 mg total) by mouth 2 (two) times daily.  60 tablet  1  . Multiple Vitamin (MULTIVITAMIN WITH MINERALS) TABS tablet Take 1 tablet by mouth daily.      . polyethylene glycol (MIRALAX / GLYCOLAX) packet Take 17 g by mouth daily as needed.      . traMADol (ULTRAM) 50 MG tablet TAKE 1 TO 2 TABLETS BY MOUTH EVERY 4 HOURS AS NEEDED FOR MODERATE PAIN  30 tablet  0   No current facility-administered medications for this visit.       Physical Exam: BP 107/69  Pulse 66  Resp 16  Ht  (1.803 m)  Wt 176 lb 8 oz (80.06 kg)  BMI 24.63 kg/m2  SpO2 98%  General appearance: alert and cooperative Neurologic: intact Heart: regular rate and rhythm, S1, S2 normal, no murmur, click, rub or gallop Lungs: clear to auscultation bilaterally Abdomen: soft, non-tender; bowel sounds normal; no masses,  no organomegaly Extremities: extremities normal, atraumatic, no cyanosis or edema and Homans sign is negative, no sign of DVT Wound: Sternal incision and right leg endo- vein harvest sites are healing well   Diagnostic Studies & Laboratory data:     Recent Radiology Findings:   Dg Chest 2 View  03/31/2014   CLINICAL DATA:  Coronary artBartelsoe with angina  EXAM: CHEST  2 VIEW  COMPARISON:  February 25, 2014  FINDINGS: There is no edema or consolidation. Heart is upper normal in size with pulmonary vascularity within normal limits. No adenopathy. Patient is status post coronary artery bypass grafting. No bone lesions.  IMPRESSION: Patient is status post coronary artery bypass grafting. No edema or consolidation.   Electronically Signed   By: Bretta Bang M.D.   On: 03/31/2014 14:47      Recent Lab Findings: Lab Results  Component Value Date   WBC 10.7* 02/26/2014   HGB 8.2* 02/26/2014   HCT 24.4* 02/26/2014   PLT 224 02/26/2014   GLUCOSE 118* 02/26/2014   ALT 63* 02/21/2014   AST 36 02/21/2014   NA 136* 02/26/2014   K 4.4 02/26/2014   CL 98 02/26/2014   CREATININE 0.96 02/26/2014    BUN 13 02/26/2014   CO2 28 02/26/2014   TSH 2.700 02/26/2014   INR 1.57* 02/22/2014   HGBA1C 5.9* 02/21/2014      Assessment / Plan:   Patient doing well following coronary artery bypass grafting Patient will start cardiac rehabilitation in September He will return to see me in 4 weeks     Delight Ovens MD      [address] [place] [place] [address] Office [phone]   Beeper [phone]  03/31/2014 4:53 PM

## 2014-03-31 NOTE — Progress Notes (Signed)
Cardiac Rehab Medication Review by a Pharmacist  Does the patient  feel that his/her medications are working for him/her?  yes  Has the patient been experiencing any side effects to the medications prescribed?  no  Does the patient measure his/her own blood pressure or blood glucose at home?  Yes; measures blood pressure and it has been "good"  Does the patient have any problems obtaining medications due to transportation or finances?   no  Understanding of regimen: excellent Understanding of indications: excellent Potential of compliance: excellent    Pharmacist comments: Patient seems to have an excellent understanding of his medication regimen.  His wife was present and seems to help the patient with his medications. Patient was interested in decreasing Aspirin dose from 325 mg to 81 mg.  I encouraged the patient to consult with his MD before reducing the dose of any of his medications. He seems very compliant overall.    Natividad Brood 03/31/2014 9:38 AM

## 2014-04-04 ENCOUNTER — Encounter (HOSPITAL_COMMUNITY): Payer: BC Managed Care – PPO

## 2014-04-06 ENCOUNTER — Encounter (HOSPITAL_COMMUNITY): Payer: BC Managed Care – PPO

## 2014-04-08 ENCOUNTER — Encounter (HOSPITAL_COMMUNITY): Payer: BC Managed Care – PPO

## 2014-04-13 ENCOUNTER — Encounter (HOSPITAL_COMMUNITY)
Admission: RE | Admit: 2014-04-13 | Discharge: 2014-04-13 | Disposition: A | Discharge status: Home / Self Care | Payer: BC Managed Care – PPO | Source: Ambulatory Visit | Attending: Cardiology | Admitting: Cardiology

## 2014-04-13 DIAGNOSIS — R079 Chest pain, unspecified: Secondary | ICD-10-CM | POA: Insufficient documentation

## 2014-04-13 DIAGNOSIS — Z7982 Long term (current) use of aspirin: Secondary | ICD-10-CM | POA: Insufficient documentation

## 2014-04-13 DIAGNOSIS — Z5189 Encounter for other specified aftercare: Secondary | ICD-10-CM | POA: Diagnosis not present

## 2014-04-13 DIAGNOSIS — E785 Hyperlipidemia, unspecified: Secondary | ICD-10-CM | POA: Insufficient documentation

## 2014-04-13 DIAGNOSIS — I2510 Atherosclerotic heart disease of native coronary artery without angina pectoris: Secondary | ICD-10-CM | POA: Diagnosis not present

## 2014-04-13 DIAGNOSIS — Z951 Presence of aortocoronary bypass graft: Secondary | ICD-10-CM | POA: Diagnosis not present

## 2014-04-13 NOTE — Progress Notes (Signed)
Pt started cardiac rehab today.  Pt tolerated light exercise without difficulty. Telemetry rhythm Sinus. Vital signs stable. Will continue to monitor the patient throughout  the program.  

## 2014-04-15 ENCOUNTER — Encounter (HOSPITAL_COMMUNITY)
Admission: RE | Admit: 2014-04-15 | Discharge: 2014-04-15 | Disposition: A | Discharge status: Home / Self Care | Payer: BC Managed Care – PPO | Source: Ambulatory Visit | Attending: Cardiology | Admitting: Cardiology

## 2014-04-15 DIAGNOSIS — Z5189 Encounter for other specified aftercare: Secondary | ICD-10-CM | POA: Diagnosis not present

## 2014-04-18 ENCOUNTER — Encounter (HOSPITAL_COMMUNITY)
Admission: RE | Admit: 2014-04-18 | Discharge: 2014-04-18 | Disposition: A | Discharge status: Home / Self Care | Payer: BC Managed Care – PPO | Source: Ambulatory Visit | Attending: Cardiology | Admitting: Cardiology

## 2014-04-18 DIAGNOSIS — Z5189 Encounter for other specified aftercare: Secondary | ICD-10-CM | POA: Diagnosis not present

## 2014-04-18 NOTE — Progress Notes (Signed)
Reviewed home exercise with pt today.  Pt plans to walk at home for exercise.  Reviewed THR, pulse, RPE, sign and symptoms, and when to call 911 or MD.  Pt voiced understanding. Lynnie Koehler, MA, ACSM RCEP   

## 2014-04-20 ENCOUNTER — Encounter (HOSPITAL_COMMUNITY)
Admission: RE | Admit: 2014-04-20 | Discharge: 2014-04-20 | Disposition: A | Discharge status: Home / Self Care | Payer: BC Managed Care – PPO | Source: Ambulatory Visit | Attending: Cardiology | Admitting: Cardiology

## 2014-04-20 DIAGNOSIS — Z5189 Encounter for other specified aftercare: Secondary | ICD-10-CM | POA: Diagnosis not present

## 2014-04-22 ENCOUNTER — Encounter (HOSPITAL_COMMUNITY)
Admission: RE | Admit: 2014-04-22 | Discharge: 2014-04-22 | Disposition: A | Discharge status: Home / Self Care | Payer: BC Managed Care – PPO | Source: Ambulatory Visit | Attending: Cardiology | Admitting: Cardiology

## 2014-04-22 DIAGNOSIS — Z5189 Encounter for other specified aftercare: Secondary | ICD-10-CM | POA: Diagnosis not present

## 2014-04-25 ENCOUNTER — Encounter (HOSPITAL_COMMUNITY)
Admission: RE | Admit: 2014-04-25 | Discharge: 2014-04-25 | Disposition: A | Discharge status: Home / Self Care | Payer: BC Managed Care – PPO | Source: Ambulatory Visit | Attending: Cardiology | Admitting: Cardiology

## 2014-04-25 DIAGNOSIS — Z5189 Encounter for other specified aftercare: Secondary | ICD-10-CM | POA: Diagnosis not present

## 2014-04-27 ENCOUNTER — Encounter (HOSPITAL_COMMUNITY)
Admission: RE | Admit: 2014-04-27 | Discharge: 2014-04-27 | Disposition: A | Discharge status: Home / Self Care | Payer: BC Managed Care – PPO | Source: Ambulatory Visit | Attending: Cardiology | Admitting: Cardiology

## 2014-04-27 DIAGNOSIS — Z5189 Encounter for other specified aftercare: Secondary | ICD-10-CM | POA: Diagnosis not present

## 2014-04-29 ENCOUNTER — Encounter (HOSPITAL_COMMUNITY)
Admission: RE | Admit: 2014-04-29 | Discharge: 2014-04-29 | Disposition: A | Discharge status: Home / Self Care | Payer: BC Managed Care – PPO | Source: Ambulatory Visit | Attending: Cardiology | Admitting: Cardiology

## 2014-04-29 DIAGNOSIS — Z5189 Encounter for other specified aftercare: Secondary | ICD-10-CM | POA: Diagnosis not present

## 2014-05-02 ENCOUNTER — Encounter (HOSPITAL_COMMUNITY)
Admission: RE | Admit: 2014-05-02 | Discharge: 2014-05-02 | Disposition: A | Discharge status: Home / Self Care | Payer: BC Managed Care – PPO | Source: Ambulatory Visit | Attending: Cardiology | Admitting: Cardiology

## 2014-05-02 ENCOUNTER — Ambulatory Visit (HOSPITAL_COMMUNITY)
Admission: RE | Admit: 2014-05-02 | Discharge: 2014-05-02 | Disposition: A | Discharge status: Home / Self Care | Payer: BC Managed Care – PPO | Source: Ambulatory Visit | Attending: Family Medicine | Admitting: Family Medicine

## 2014-05-02 DIAGNOSIS — I2510 Atherosclerotic heart disease of native coronary artery without angina pectoris: Secondary | ICD-10-CM | POA: Insufficient documentation

## 2014-05-02 DIAGNOSIS — R0789 Other chest pain: Secondary | ICD-10-CM | POA: Insufficient documentation

## 2014-05-02 DIAGNOSIS — Z5189 Encounter for other specified aftercare: Secondary | ICD-10-CM | POA: Diagnosis not present

## 2014-05-02 DIAGNOSIS — E785 Hyperlipidemia, unspecified: Secondary | ICD-10-CM | POA: Insufficient documentation

## 2014-05-02 DIAGNOSIS — Z951 Presence of aortocoronary bypass graft: Secondary | ICD-10-CM | POA: Diagnosis not present

## 2014-05-02 NOTE — Progress Notes (Signed)
Patient noted to have ST elevation on the monitor during cool down today at cardiac rehab this afternoon.  Patient reported having pain on deep inspiration in his clavicle that radiated to his left clavicle and upper arm.  12 lead ECG showed acute MI in the inferior leads. Dr Donnie Aho called and notified.  Advised that the patient go the ED for treatment.  Mr Ryan Douglas told the cardiac rehab staff that he had a terrible time during his hospitalization and would not go to the ED at this time. Dr Donnie Aho called and notified.  Dr Donnie Aho spoke with Mr Ryan Douglas over the phone and explained the risk and benefits of a cardiac cathereitzation and an untreated MI including heart muscle damage and death.  Patient adamantly refused.  Dr Donnie Aho said the patient wants to leave against medical advice.The patient spoke with his wife who wanted him to go for Evaluation he refused her request as well. Rapid response RN, myself and coworkers  talked with the patient to try to convince him of need for medical evaluation.   We also offered that Mr Ryan Douglas seek treatment at another facility after going to the emergency department. Patient refused.  Patient requested that his ECG monitor be removed. Mr Ryan Douglas signed the [facility] form. I called the patient's wife to see if she could get the patient to stay until she arrived. Mr Ryan Douglas refused. Exit blood pressure 116/86 heart rate 99.  Oxygen saturation 96% on room air. Temperature 99.1. I walked with the patient to the parking lot.

## 2014-05-02 NOTE — Progress Notes (Signed)
EKG CRITICAL VALUE     12 lead EKG performed.  Critical value noted.Gladstone Lighter, RN notified.   Neo Yepiz L, CCT 05/02/2014 4:15 PM

## 2014-05-03 ENCOUNTER — Encounter: Payer: Self-pay | Admitting: Cardiology

## 2014-05-04 ENCOUNTER — Encounter (HOSPITAL_COMMUNITY): Payer: BC Managed Care – PPO

## 2014-05-04 ENCOUNTER — Telehealth (HOSPITAL_COMMUNITY): Payer: Self-pay | Admitting: *Deleted

## 2014-05-04 NOTE — Progress Notes (Signed)
[address]       [place] [address]             [phone]      Averey Trompeter [facility] Medical Record #[id number] Date of Birth: 03/07/1951  Referring: Othella Boyer, MD Primary Care: Redmond Baseman, MD  Chief Complaint:   POST OP FOLLOW UP 02/22/2014  OPERATIVE REPORT  PREOPERATIVE DIAGNOSIS: Unstable angina.  POSTOPERATIVE DIAGNOSIS: Unstable angina.  SURGICAL PROCEDURE: Coronary artery bypass grafting x4 with the left  internal mammary to the left anterior descending coronary artery,  reverse saphenous vein graft to the diagonal coronary artery, reverse  saphenous vein graft to the second obtuse marginal coronary artery,  reverse saphenous vein graft to the distal right coronary artery with  right thigh and calf endovein harvesting.  History of Present Illness:     Patient doing well following coronary bypass grafting approximately 8 weeks ago. Patient notes going to cardiac rehabilitation on Monday without any symptoms. He was told to go immediately to the emergency room because he was having an MI, he refused. He noted he felt fine and had no chest discomfort other than the paresthesias of the left anterior chest related to the memory that he had had since surgery. He saw Dr. Donnie Aho in the office yesterday with an EKG and blood work . He was told he could return to cardiac rehabilitation. So far cardiac rehabilitation has refused him to come back.    Past Medical History  Diagnosis Date  . CAD (coronary artery disease), native coronary artery 02/17/2014    02/17/14 severe 3 VD at cath with normal LV  Early positive TMT   . Shortness of breath     with exertion  . Wears glasses   . Diverticulitis   . Hyperlipidemia   . Snoring 03/03/2014  . 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 Former Smoker  Smokeless tobacco  . Never Used    Comment: Quit smoking cigarettes in 1994    History  Alcohol Use No      Allergies  Allergen Reactions  . Morphine And Related     Headache  . Oxycodone     Headache    Current Outpatient Prescriptions  Medication Sig Dispense Refill  . aspirin EC 325 MG EC tablet Take 1 tablet (325 mg total) by mouth daily.  30 tablet  0  . metoprolol tartrate (LOPRESSOR) 25 MG tablet Take 1 tablet (25 mg total) by mouth 2 (two) times daily.  60 tablet  1  . Multiple Vitamin (MULTIVITAMIN WITH MINERALS) TABS tablet Take 1 tablet by mouth daily.      . polyethylene glycol (MIRALAX / GLYCOLAX) packet Take 17 g by mouth daily as needed.      . rosuvastatin (CRESTOR) 10 MG tablet Take 10 mg by mouth daily.       No current facility-administered medications for this visit.       Physical Exam: BP 122/74  Pulse 79  Resp 16  Ht 5\' 11"  (1.803 m)  Wt 175 lb (79.379 kg)  BMI 24.42 kg/m2  SpO2 98%  General appearance: alert and cooperative Neurologic: intact Heart: regular rate and rhythm, S1, S2 normal, no murmur, click, rub or gallop Lungs: clear to auscultation bilaterally Abdomen: soft, non-tender; bowel sounds normal; no masses,  no organomegaly Extremities: extremities normal, atraumatic, no cyanosis or edema and Homans sign is negative, no sign of DVT  Wound: Sternal incision and right leg endo- vein harvest sites are healing well   Diagnostic Studies & Laboratory data:     Recent Radiology Findings:   No results found.    Recent Lab Findings: Lab Results  Component Value Date   WBC 10.7* 02/26/2014   HGB 8.2* 02/26/2014   HCT 24.4* 02/26/2014   PLT 224 02/26/2014   GLUCOSE 118* 02/26/2014   ALT 63* 02/21/2014   AST 36 02/21/2014   NA 136* 02/26/2014   K 4.4 02/26/2014   CL 98 02/26/2014   CREATININE 0.96 02/26/2014   BUN 13 02/26/2014   CO2 28 02/26/2014   TSH 2.700 02/26/2014   INR 1.57* 02/22/2014   HGBA1C 5.9* 02/21/2014      Assessment / Plan:   Patient doing well following coronary artery bypass grafting, he has a followup appointment with  Dr. Donnie Ahoilley so he can become re\re and rolled in cardiac rehabilitation which he wishes to do. We'll plan to see him back in one month    Delight OvensEdward B Eleny Cortez MD      [address] [place] [id number] Office [phone]   Beeper [phone]  05/05/2014 9:56 AM

## 2014-05-05 ENCOUNTER — Ambulatory Visit (INDEPENDENT_AMBULATORY_CARE_PROVIDER_SITE_OTHER): Payer: Self-pay | Admitting: Cardiothoracic Surgery

## 2014-05-05 ENCOUNTER — Encounter: Payer: Self-pay | Admitting: Cardiothoracic Surgery

## 2014-05-05 VITALS — BP 122/74 | HR 79 | Resp 16 | Ht 71.0 in | Wt 175.0 lb

## 2014-05-05 DIAGNOSIS — Z951 Presence of aortocoronary bypass graft: Secondary | ICD-10-CM

## 2014-05-05 DIAGNOSIS — I2510 Atherosclerotic heart disease of native coronary artery without angina pectoris: Secondary | ICD-10-CM

## 2014-05-06 ENCOUNTER — Encounter (HOSPITAL_COMMUNITY): Payer: BC Managed Care – PPO

## 2014-05-09 ENCOUNTER — Encounter (HOSPITAL_COMMUNITY): Payer: BC Managed Care – PPO

## 2014-05-09 ENCOUNTER — Encounter: Payer: Self-pay | Admitting: Cardiology

## 2014-05-09 NOTE — Progress Notes (Signed)
Patient ID: Ryan Douglas, male   DOB: 03/01/1951, 63 y.o.   MRN: [id number]   [name], Rubin C  Date of visit:  05/09/2014 DOB:  Oct 12, 1950    Age:  63 yrs. Medical record number:  [id number]     Account number:  [id number] Primary Care Provider: [name], [name] ____________________________ CURRENT DIAGNOSES  1. Atherosclerotic heart disease of native coronary artery   2. Presence of aortocoronary bypass graft  3. Hyperlipidemia, unspecified ____________________________ ALLERGIES  Morphine, Headache ____________________________ MEDICATIONS  1. multivitamin tablet, 1 p.o. daily  2. Miralax 17 gram/dose oral powder, PRN  3. atorvastatin 40 mg tablet, 1 p.o. daily  4. aspirin 81 mg chewable tablet, 3 qd  5. tramadol 50 mg tablet, PRN  6. metoprolol tartrate 25 mg tablet, BID ____________________________ HISTORY OF PRESENT ILLNESS  The patient returns and continues to feel well with no chest pain or cardiac symptoms. An echocardiogram today shows concentric LVH with mild septal thickening. His valves looked fine with only mild mitral regurgitation. In one view there may have been very minimal inferior hypokinesis but I don't think this had anything to do with the rehabilitation session last week. His cardiac enzymes have been negative and his EKG in the office was similar to the one done in rehabilitation.  IMPRESSIONS:  From a cardiovascular viewpoint it is safe for him to go back to rehabilitation. He does not need to have an exercise test and may resume rehabilitation. He would very much like to go back to rehabilitation and to resume this and I talked to the director about it. The reason he did not wish to go to the hospital is that she truly did not feel that he was having any clinical event and was concerned about the expense and all that would impale if he went that route. In retrospect this appeared to be a reasonable decision since his enzymes were negative and he was not having  typical chest pain at the time and felt fine according to him.  Evidently someone had noticed ST elevation on the monitor (unclear if leads were changed or not).  I will place this note in EPIC. I also spoke to the director of rehabilitation and told her that I felt it was okay for him to resume rehabilitation. Followup with me in 3 months.   Darden PalmerW. Spencer Keondra Haydu, Jr. MD [facility]

## 2014-05-09 NOTE — Progress Notes (Signed)
Patient ID: Ryan Douglas, male   DOB: 10/11/1950, 63 y.o.   MRN: [id number]   [name], Ryan Douglas  Date of visit:  05/03/2014 DOB:  June 03, 1951    Age:  63 yrs. Medical record number:  [id number]     Account number:  [id number] Primary Care Provider: [name], [name] ____________________________ CURRENT DIAGNOSES  1. CAD,Native  2. Chest Pain  3. Surgery-Aortocoronary Bypass Grafting  4. Hyperlipidemia ____________________________ ALLERGIES  Morphine, Headache ____________________________ MEDICATIONS  1. multivitamin tablet, 1 p.o. daily  2. Miralax 17 gram/dose oral powder, PRN  3. metoprolol tartrate 25 mg tablet, BID  4. atorvastatin 40 mg tablet, 1 p.o. daily  5. aspirin 81 mg chewable tablet, 3 qd  6. tramadol 50 mg tablet, PRN ____________________________ HISTORY OF PRESENT ILLNESS Patient seen early for cardiac followup. I received a call yesterday from rehabilitation describing chest pain associated with ST elevation on the monitor. Assessment EKG showed what appeared to be an acute inferior infarction. The patient basically refused to go to the emergency room and I talked with them over the phone he basically signed out against advice. He returns this afternoon. He was feeling well when he went to rehabilitation and well on rehabilitation without any symptoms when this was noted. He evidently had had some shoulder pain for a couple of days that was relieved with Aleve and had some vague pleuritic upper left chest pain but did not have any anginal type pain. He actually feels fine today. I had him come back today so we can recheck. He has not had any recurrent anginal pain and has been able to walk his dog and do other activities without symptoms. He denies angina and has no PND, orthopnea or claudication. ____________________________ PAST HISTORY  Past Medical Illnesses:  hyperlipidemia, diverticulitis;  Cardiovascular Illnesses:  CAD;  Surgical Procedures:  no prior surgical  procedures;  Cardiology Procedures-Invasive:  cardiac cath (left) July 2015, CABG with LIMA to LAD, SVG to dx, SVG to OM, SVG to RCA 02/22/14 Dr. Tyrone SageGerhardt;  Cardiology Procedures-Noninvasive:  treadmill July 2015;  Cardiac Cath Results:  normal Left main, 95% stenosis proximal LAD, 70% stenosis ostial Diag 1, 95% stenosis proximal CFX, subtotal occlusion CFX, subtotal occlusion OM 1 OM 2, 95% stenosis proximal RCA;  LVEF of 60% documented via cardiac cath on 02/17/2014,   ____________________________ CARDIO-PULMONARY TEST DATES EKG Date:  05/03/2014;   Cardiac Cath Date:  02/17/2014;  CABG: 02/22/2014;  Chest Xray Date: 02/09/2014;   ____________________________ FAMILY HISTORY Brother -- Brother dead, Death of unknown cause Father -- Father dead, CVA Mother -- Mother dead, Carcinoma of breast, Coronary Artery Disease Sister -- Sister alive and well ____________________________ SOCIAL HISTORY Alcohol Use:  no alcohol use;  Smoking:  used to smoke but quit 1994, 20 pack year history;  Diet:  fat modified diet and vegetarian;  Lifestyle:  married;  Exercise:  walking for approximately 45 minutes 4 days per week;  Occupation:  Tourist information centre managersystem analyst [employer];  Residence:  lives with wife;   ____________________________ REVIEW OF SYSTEMS General:  denies recent weight change, fatique or change in exercise tolerance. Eyes: wears eye glasses/contact lenses, denies diplopia, glaucoma or visual field defects. Respiratory: denies dyspnea, cough, wheezing or hemoptysis. Cardiovascular:  please review HPI Abdominal: constipation Genitourinary-Male: no dysuria, urgency, frequency, or nocturia  Musculoskeletal:  chronic low back pain  ____________________________ PHYSICAL EXAMINATION VITAL SIGNS  Blood Pressure:  116/70 Sitting, Left arm, regular cuff  , 112/68 Standing, Left arm and regular cuff   Pulse:  88/min. Weight:  175.00 lbs. Height:  71.00"BMI: 24  Constitutional:  pleasant African Americian male in no acute  distress Skin:  warm and dry to touch, no apparent skin lesions, or masses noted. Head:  normocephalic, normal hair pattern, no masses or tenderness Neck:  supple, without massess. No JVD, thyromegaly or carotid bruits. Carotid upstroke normal. Chest:  normal symmetry, clear to auscultation., healed median sternotomy scar Cardiac:  regular rhythm, normal S1 and S2, No S3 or S4, no murmurs, gallops or rubs detected. Peripheral Pulses:  the femoral,dorsalis pedis, and posterior tibial pulses are full and equal bilaterally with no bruits auscultated. Extremities & Back:  well healed saphenous vein donor site RLE, no edema present Neurological:  no gross motor or sensory deficits noted, affect appropriate, oriented x3. ____________________________ MOST RECENT LIPID PANEL 04/08/14  CHOL TOTL 134 mg/dl, LDL 70 NM, HDL 49 mg/dl, TRIGLYCER 74 mg/dl and CHOL/HDL 2.7 (Calc) ____________________________ IMPRESSIONS/PLAN  1. Unclear what the ST elevation noted on EKG was yesterday. Some ST depression in V2 has resolved today but he does have what looks like a previous inferior infarction with persistent ST elevation in the inferior leads. 2. Recent coronary bypass grafting 3. Hyperlipidemia  Recommendations:  EKG shows inferior ST elevation and some biphasic T waves laterally. He really looks good today and did not have a clinical event yesterday although the EKG looked worrisome. Recommended that he get some cardiac enzymes today to see if he has any elevation of CPK-MB which would be elevated if he had a clinical event yesterday. I also recommended that he have an echocardiogram and we will discuss returning to work afterwards. I would not go to rehabilitation for the time being until this is resolved. ____________________________ TODAYS ORDERS  1. Troponin-I  2. CKMB  3. 2D, color flow, doppler: First Available  4. 12 Lead EKG: Today                        ____________________________ Cardiology Physician:  Darden Palmer. MD [facility]

## 2014-05-11 ENCOUNTER — Encounter (HOSPITAL_COMMUNITY): Payer: BC Managed Care – PPO

## 2014-05-13 ENCOUNTER — Encounter (HOSPITAL_COMMUNITY): Payer: BC Managed Care – PPO

## 2014-05-13 ENCOUNTER — Telehealth (HOSPITAL_COMMUNITY): Payer: Self-pay | Admitting: *Deleted

## 2014-05-16 ENCOUNTER — Encounter (HOSPITAL_COMMUNITY)
Admission: RE | Admit: 2014-05-16 | Discharge: 2014-05-16 | Disposition: A | Discharge status: Home / Self Care | Payer: BC Managed Care – PPO | Source: Ambulatory Visit | Attending: Cardiology | Admitting: Cardiology

## 2014-05-16 ENCOUNTER — Telehealth (HOSPITAL_COMMUNITY): Payer: Self-pay | Admitting: *Deleted

## 2014-05-16 DIAGNOSIS — R079 Chest pain, unspecified: Secondary | ICD-10-CM | POA: Insufficient documentation

## 2014-05-16 DIAGNOSIS — E785 Hyperlipidemia, unspecified: Secondary | ICD-10-CM | POA: Insufficient documentation

## 2014-05-16 DIAGNOSIS — I2510 Atherosclerotic heart disease of native coronary artery without angina pectoris: Secondary | ICD-10-CM | POA: Insufficient documentation

## 2014-05-16 DIAGNOSIS — Z7982 Long term (current) use of aspirin: Secondary | ICD-10-CM | POA: Insufficient documentation

## 2014-05-16 DIAGNOSIS — Z5189 Encounter for other specified aftercare: Secondary | ICD-10-CM | POA: Insufficient documentation

## 2014-05-16 DIAGNOSIS — Z951 Presence of aortocoronary bypass graft: Secondary | ICD-10-CM | POA: Diagnosis not present

## 2014-05-16 NOTE — Progress Notes (Addendum)
Ryan Douglas returned to cardiac rehab and exercised without difficulty or complaints. Vital signs stable. Telemetry rhythm Sinus T wave inversion present at the beginning of exercise but was not present during cool down. Will continue to monitor the patient throughout  the program.

## 2014-05-18 ENCOUNTER — Encounter (HOSPITAL_COMMUNITY)
Admission: RE | Admit: 2014-05-18 | Discharge: 2014-05-18 | Disposition: A | Discharge status: Home / Self Care | Payer: BC Managed Care – PPO | Source: Ambulatory Visit | Attending: Cardiology | Admitting: Cardiology

## 2014-05-18 DIAGNOSIS — Z5189 Encounter for other specified aftercare: Secondary | ICD-10-CM | POA: Diagnosis not present

## 2014-05-20 ENCOUNTER — Encounter (HOSPITAL_COMMUNITY)
Admission: RE | Admit: 2014-05-20 | Discharge: 2014-05-20 | Disposition: A | Discharge status: Home / Self Care | Payer: BC Managed Care – PPO | Source: Ambulatory Visit | Attending: Cardiology | Admitting: Cardiology

## 2014-05-20 DIAGNOSIS — Z5189 Encounter for other specified aftercare: Secondary | ICD-10-CM | POA: Diagnosis not present

## 2014-05-23 ENCOUNTER — Encounter (HOSPITAL_COMMUNITY)
Admission: RE | Admit: 2014-05-23 | Discharge: 2014-05-23 | Disposition: A | Discharge status: Home / Self Care | Payer: BC Managed Care – PPO | Source: Ambulatory Visit | Attending: Cardiology | Admitting: Cardiology

## 2014-05-23 DIAGNOSIS — Z5189 Encounter for other specified aftercare: Secondary | ICD-10-CM | POA: Diagnosis not present

## 2014-05-25 ENCOUNTER — Encounter (HOSPITAL_COMMUNITY)
Admission: RE | Admit: 2014-05-25 | Discharge: 2014-05-25 | Disposition: A | Discharge status: Home / Self Care | Payer: BC Managed Care – PPO | Source: Ambulatory Visit | Attending: Cardiology | Admitting: Cardiology

## 2014-05-25 DIAGNOSIS — Z5189 Encounter for other specified aftercare: Secondary | ICD-10-CM | POA: Diagnosis not present

## 2014-05-27 ENCOUNTER — Encounter (HOSPITAL_COMMUNITY)
Admission: RE | Admit: 2014-05-27 | Discharge: 2014-05-27 | Disposition: A | Discharge status: Home / Self Care | Payer: BC Managed Care – PPO | Source: Ambulatory Visit | Attending: Cardiology | Admitting: Cardiology

## 2014-05-27 DIAGNOSIS — Z5189 Encounter for other specified aftercare: Secondary | ICD-10-CM | POA: Diagnosis not present

## 2014-05-30 ENCOUNTER — Other Ambulatory Visit: Payer: Self-pay | Admitting: Physician Assistant

## 2014-05-30 ENCOUNTER — Encounter (HOSPITAL_COMMUNITY)
Admission: RE | Admit: 2014-05-30 | Discharge: 2014-05-30 | Disposition: A | Discharge status: Home / Self Care | Payer: BC Managed Care – PPO | Source: Ambulatory Visit | Attending: Cardiology | Admitting: Cardiology

## 2014-05-30 DIAGNOSIS — Z5189 Encounter for other specified aftercare: Secondary | ICD-10-CM | POA: Diagnosis not present

## 2014-06-01 ENCOUNTER — Encounter (HOSPITAL_COMMUNITY)
Admission: RE | Admit: 2014-06-01 | Discharge: 2014-06-01 | Disposition: A | Discharge status: Home / Self Care | Payer: BC Managed Care – PPO | Source: Ambulatory Visit | Attending: Cardiology | Admitting: Cardiology

## 2014-06-01 DIAGNOSIS — Z5189 Encounter for other specified aftercare: Secondary | ICD-10-CM | POA: Diagnosis not present

## 2014-06-03 ENCOUNTER — Encounter (HOSPITAL_COMMUNITY): Payer: BC Managed Care – PPO

## 2014-06-06 ENCOUNTER — Encounter (HOSPITAL_COMMUNITY): Payer: BC Managed Care – PPO

## 2014-06-08 ENCOUNTER — Encounter (HOSPITAL_COMMUNITY): Payer: BC Managed Care – PPO

## 2014-06-08 DIAGNOSIS — Z7982 Long term (current) use of aspirin: Secondary | ICD-10-CM | POA: Insufficient documentation

## 2014-06-08 DIAGNOSIS — E785 Hyperlipidemia, unspecified: Secondary | ICD-10-CM | POA: Diagnosis not present

## 2014-06-08 DIAGNOSIS — Z951 Presence of aortocoronary bypass graft: Secondary | ICD-10-CM | POA: Insufficient documentation

## 2014-06-08 DIAGNOSIS — I2510 Atherosclerotic heart disease of native coronary artery without angina pectoris: Secondary | ICD-10-CM | POA: Diagnosis not present

## 2014-06-08 DIAGNOSIS — R079 Chest pain, unspecified: Secondary | ICD-10-CM | POA: Diagnosis not present

## 2014-06-08 DIAGNOSIS — Z5189 Encounter for other specified aftercare: Secondary | ICD-10-CM | POA: Insufficient documentation

## 2014-06-09 ENCOUNTER — Encounter: Payer: Self-pay | Admitting: Cardiothoracic Surgery

## 2014-06-09 ENCOUNTER — Ambulatory Visit (INDEPENDENT_AMBULATORY_CARE_PROVIDER_SITE_OTHER): Payer: BC Managed Care – PPO | Admitting: Cardiothoracic Surgery

## 2014-06-09 VITALS — BP 131/69 | HR 80 | Ht 71.0 in | Wt 175.0 lb

## 2014-06-09 DIAGNOSIS — Z951 Presence of aortocoronary bypass graft: Secondary | ICD-10-CM

## 2014-06-09 DIAGNOSIS — I2510 Atherosclerotic heart disease of native coronary artery without angina pectoris: Secondary | ICD-10-CM

## 2014-06-09 NOTE — Progress Notes (Signed)
[address]       [place] [id number]             [phone]      [facility] Medical Record #[id number] Date of Birth: 09/09/1950  Referring: Othella Boyerilley, William S, MD Primary Care: Redmond BasemanWONG,FRANCIS PATRICK, MD  Chief Complaint:   POST OP FOLLOW UP 02/22/2014  OPERATIVE REPORT  PREOPERATIVE DIAGNOSIS: Unstable angina.  POSTOPERATIVE DIAGNOSIS: Unstable angina.  SURGICAL PROCEDURE: Coronary artery bypass grafting x4 with the left  internal mammary to the left anterior descending coronary artery,  reverse saphenous vein graft to the diagonal coronary artery, reverse  saphenous vein graft to the second obtuse marginal coronary artery,  reverse saphenous vein graft to the distal right coronary artery with  right thigh and calf endovein harvesting.  History of Present Illness:     Patient doing well following coronary bypass grafting approximately 12 weeks ago. Patient notes going to cardiac rehabilitation on Monday without any symptoms. He feels like cardiac rehabilitation is slowing down. He denies angina or shortness of breath with exertion. Would like to return to playing tennis in the next month or so.   Past Medical History  Diagnosis Date  . CAD (coronary artery disease), native coronary artery 02/17/2014    02/17/14 severe 3 VD at cath with normal LV  Early positive TMT   . Shortness of breath     with exertion  . Wears glasses   . Diverticulitis   . Hyperlipidemia   . Snoring 03/03/2014  . 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 Former Smoker  Smokeless tobacco  . Never Used    Comment: Quit smoking cigarettes in 1994    History  Alcohol Use No     Allergies  Allergen Reactions  . Morphine And Related     Headache  . Oxycodone     Headache    Current Outpatient Prescriptions  Medication Sig Dispense Refill  . aspirin EC 325 MG EC tablet Take 1 tablet (325 mg total) by mouth daily. 30 tablet 0  .  metoprolol tartrate (LOPRESSOR) 25 MG tablet Take 1 tablet (25 mg total) by mouth 2 (two) times daily. 60 tablet 1  . Multiple Vitamin (MULTIVITAMIN WITH MINERALS) TABS tablet Take 1 tablet by mouth daily.    . polyethylene glycol (MIRALAX / GLYCOLAX) packet Take 17 g by mouth daily as needed.    . rosuvastatin (CRESTOR) 10 MG tablet Take 10 mg by mouth daily.     No current facility-administered medications for this visit.       Physical Exam: BP 131/69 mmHg  Pulse 80  Ht 5\' 11"  (1.803 m)  Wt 175 lb (79.379 kg)  BMI 24.42 kg/m2  SpO2 95%  General appearance: alert and cooperative Neurologic: intact Heart: regular rate and rhythm, S1, S2 normal, no murmur, click, rub or gallop Lungs: clear to auscultation bilaterally Abdomen: soft, non-tender; bowel sounds normal; no masses,  no organomegaly Extremities: extremities normal, atraumatic, no cyanosis or edema and Homans sign is negative, no sign of DVT Wound: Sternal incision and right leg endo- vein harvest sites are healing well   Diagnostic Studies & Laboratory data:     Recent Radiology Findings:   No results found.    Recent Lab Findings: Lab Results  Component Value Date   WBC 10.7* 02/26/2014   HGB 8.2* 02/26/2014   HCT 24.4* 02/26/2014   PLT 224 02/26/2014  GLUCOSE 118* 02/26/2014   ALT 63* 02/21/2014   AST 36 02/21/2014   NA 136* 02/26/2014   K 4.4 02/26/2014   CL 98 02/26/2014   CREATININE 0.96 02/26/2014   BUN 13 02/26/2014   CO2 28 02/26/2014   TSH 2.700 02/26/2014   INR 1.57* 02/22/2014   HGBA1C 5.9* 02/21/2014      Assessment / Plan:   Patient now 3 months after CABG, back working full-time instilled enrolled in cardiac rehabilitation. He has no cardiac symptoms I reviewed with him to slowly resume playing tennis as tolerated I've not made a return appointment to see him but would be glad to see him at his or Dr. York Spanielilley's request  Delight OvensEdward B Yazir Koerber MD      [address] [place] [id number] Office [phone]   Beeper [phone]  06/09/2014 2:06 PM

## 2014-06-10 ENCOUNTER — Encounter (HOSPITAL_COMMUNITY)
Admission: RE | Admit: 2014-06-10 | Discharge: 2014-06-10 | Disposition: A | Discharge status: Home / Self Care | Payer: BC Managed Care – PPO | Source: Ambulatory Visit | Attending: Cardiology | Admitting: Cardiology

## 2014-06-10 DIAGNOSIS — Z5189 Encounter for other specified aftercare: Secondary | ICD-10-CM | POA: Diagnosis not present

## 2014-06-13 ENCOUNTER — Encounter (HOSPITAL_COMMUNITY): Payer: BC Managed Care – PPO

## 2014-06-15 ENCOUNTER — Encounter (HOSPITAL_COMMUNITY): Payer: BC Managed Care – PPO

## 2014-06-17 ENCOUNTER — Encounter (HOSPITAL_COMMUNITY): Payer: BC Managed Care – PPO

## 2014-06-20 ENCOUNTER — Encounter (HOSPITAL_COMMUNITY): Payer: BC Managed Care – PPO

## 2014-06-20 ENCOUNTER — Telehealth (HOSPITAL_COMMUNITY): Payer: Self-pay | Admitting: *Deleted

## 2014-06-22 ENCOUNTER — Encounter (HOSPITAL_COMMUNITY)
Admission: RE | Admit: 2014-06-22 | Discharge: 2014-06-22 | Disposition: A | Discharge status: Home / Self Care | Payer: BC Managed Care – PPO | Source: Ambulatory Visit | Attending: Cardiology | Admitting: Cardiology

## 2014-06-22 DIAGNOSIS — Z5189 Encounter for other specified aftercare: Secondary | ICD-10-CM | POA: Diagnosis not present

## 2014-06-24 ENCOUNTER — Encounter (HOSPITAL_COMMUNITY)
Admission: RE | Admit: 2014-06-24 | Discharge: 2014-06-24 | Disposition: A | Discharge status: Home / Self Care | Payer: BC Managed Care – PPO | Source: Ambulatory Visit | Attending: Cardiology | Admitting: Cardiology

## 2014-06-24 DIAGNOSIS — Z5189 Encounter for other specified aftercare: Secondary | ICD-10-CM | POA: Diagnosis not present

## 2014-06-27 ENCOUNTER — Encounter (HOSPITAL_COMMUNITY)
Admission: RE | Admit: 2014-06-27 | Discharge: 2014-06-27 | Disposition: A | Discharge status: Home / Self Care | Payer: BC Managed Care – PPO | Source: Ambulatory Visit | Attending: Cardiology | Admitting: Cardiology

## 2014-06-27 DIAGNOSIS — Z5189 Encounter for other specified aftercare: Secondary | ICD-10-CM | POA: Diagnosis not present

## 2014-06-29 ENCOUNTER — Encounter (HOSPITAL_COMMUNITY)
Admission: RE | Admit: 2014-06-29 | Discharge: 2014-06-29 | Disposition: A | Discharge status: Home / Self Care | Payer: BC Managed Care – PPO | Source: Ambulatory Visit | Attending: Cardiology | Admitting: Cardiology

## 2014-06-29 DIAGNOSIS — Z5189 Encounter for other specified aftercare: Secondary | ICD-10-CM | POA: Diagnosis not present

## 2014-07-04 ENCOUNTER — Telehealth (HOSPITAL_COMMUNITY): Payer: Self-pay | Admitting: Family Medicine

## 2014-07-04 ENCOUNTER — Encounter (HOSPITAL_COMMUNITY): Payer: BC Managed Care – PPO

## 2014-07-06 ENCOUNTER — Encounter (HOSPITAL_COMMUNITY)
Admission: RE | Admit: 2014-07-06 | Discharge: 2014-07-06 | Disposition: A | Discharge status: Home / Self Care | Payer: BC Managed Care – PPO | Source: Ambulatory Visit | Attending: Cardiology | Admitting: Cardiology

## 2014-07-06 DIAGNOSIS — Z7982 Long term (current) use of aspirin: Secondary | ICD-10-CM | POA: Diagnosis not present

## 2014-07-06 DIAGNOSIS — E785 Hyperlipidemia, unspecified: Secondary | ICD-10-CM | POA: Insufficient documentation

## 2014-07-06 DIAGNOSIS — I2510 Atherosclerotic heart disease of native coronary artery without angina pectoris: Secondary | ICD-10-CM | POA: Insufficient documentation

## 2014-07-06 DIAGNOSIS — R079 Chest pain, unspecified: Secondary | ICD-10-CM | POA: Diagnosis not present

## 2014-07-06 DIAGNOSIS — Z951 Presence of aortocoronary bypass graft: Secondary | ICD-10-CM | POA: Diagnosis not present

## 2014-07-06 DIAGNOSIS — Z5189 Encounter for other specified aftercare: Secondary | ICD-10-CM | POA: Diagnosis not present

## 2014-07-08 ENCOUNTER — Encounter (HOSPITAL_COMMUNITY): Admission: RE | Admit: 2014-07-08 | Payer: BC Managed Care – PPO | Source: Ambulatory Visit

## 2014-07-08 ENCOUNTER — Telehealth (HOSPITAL_COMMUNITY): Payer: Self-pay | Admitting: *Deleted

## 2014-07-11 ENCOUNTER — Encounter (HOSPITAL_COMMUNITY)
Admission: RE | Admit: 2014-07-11 | Discharge: 2014-07-11 | Disposition: A | Discharge status: Home / Self Care | Payer: BC Managed Care – PPO | Source: Ambulatory Visit | Attending: Cardiology | Admitting: Cardiology

## 2014-07-11 DIAGNOSIS — Z5189 Encounter for other specified aftercare: Secondary | ICD-10-CM | POA: Diagnosis not present

## 2014-07-13 ENCOUNTER — Encounter (HOSPITAL_COMMUNITY): Payer: Self-pay

## 2014-07-13 ENCOUNTER — Encounter (HOSPITAL_COMMUNITY)
Admission: RE | Admit: 2014-07-13 | Discharge: 2014-07-13 | Disposition: A | Discharge status: Home / Self Care | Payer: BC Managed Care – PPO | Source: Ambulatory Visit | Attending: Cardiology | Admitting: Cardiology

## 2014-07-13 DIAGNOSIS — Z5189 Encounter for other specified aftercare: Secondary | ICD-10-CM | POA: Diagnosis not present

## 2014-07-13 NOTE — Progress Notes (Signed)
Pt graduated from cardiac rehab program today with completion of 26 exercise sessions in Phase II. Pt discharged early due to full time work schedule and goals met.   Pt maintained good attendance and progressed nicely during his participation in rehab as evidenced by increased MET level.   Medication list reconciled. Repeat  PHQ2 score- 0 .  Pt has made significant lifestyle changes and should be commended for his success. Pt feels he has achieved his goals of resuming previous lifestyle without difficulty.  Pt plans to continue exercising on his own walking daily and going to local gym.  Thank you for the referral.

## 2014-07-14 ENCOUNTER — Encounter (HOSPITAL_COMMUNITY): Payer: Self-pay | Admitting: Cardiology

## 2014-07-15 ENCOUNTER — Encounter (HOSPITAL_COMMUNITY): Payer: BC Managed Care – PPO

## 2014-07-18 ENCOUNTER — Encounter (HOSPITAL_COMMUNITY): Payer: BC Managed Care – PPO

## 2014-07-20 ENCOUNTER — Encounter (HOSPITAL_COMMUNITY): Payer: BC Managed Care – PPO

## 2014-07-22 ENCOUNTER — Encounter (HOSPITAL_COMMUNITY): Payer: BC Managed Care – PPO

## 2014-07-25 ENCOUNTER — Encounter (HOSPITAL_COMMUNITY): Payer: BC Managed Care – PPO

## 2014-07-27 ENCOUNTER — Encounter (HOSPITAL_COMMUNITY): Payer: BC Managed Care – PPO

## 2014-08-01 ENCOUNTER — Encounter (HOSPITAL_COMMUNITY): Payer: BC Managed Care – PPO

## 2014-08-03 ENCOUNTER — Encounter (HOSPITAL_COMMUNITY): Payer: BC Managed Care – PPO

## 2014-08-08 ENCOUNTER — Encounter (HOSPITAL_COMMUNITY): Payer: BC Managed Care – PPO

## 2014-08-10 ENCOUNTER — Encounter (HOSPITAL_COMMUNITY): Payer: BC Managed Care – PPO

## 2014-08-12 ENCOUNTER — Encounter (HOSPITAL_COMMUNITY): Payer: BC Managed Care – PPO

## 2015-05-05 IMAGING — CR DG CHEST 1V PORT
1 series · 1 of 1 positions shown · non-contrast
Comparison: None.

CLINICAL DATA: Status post CABG

EXAM:
PORTABLE CHEST - 1 VIEW

[AP]
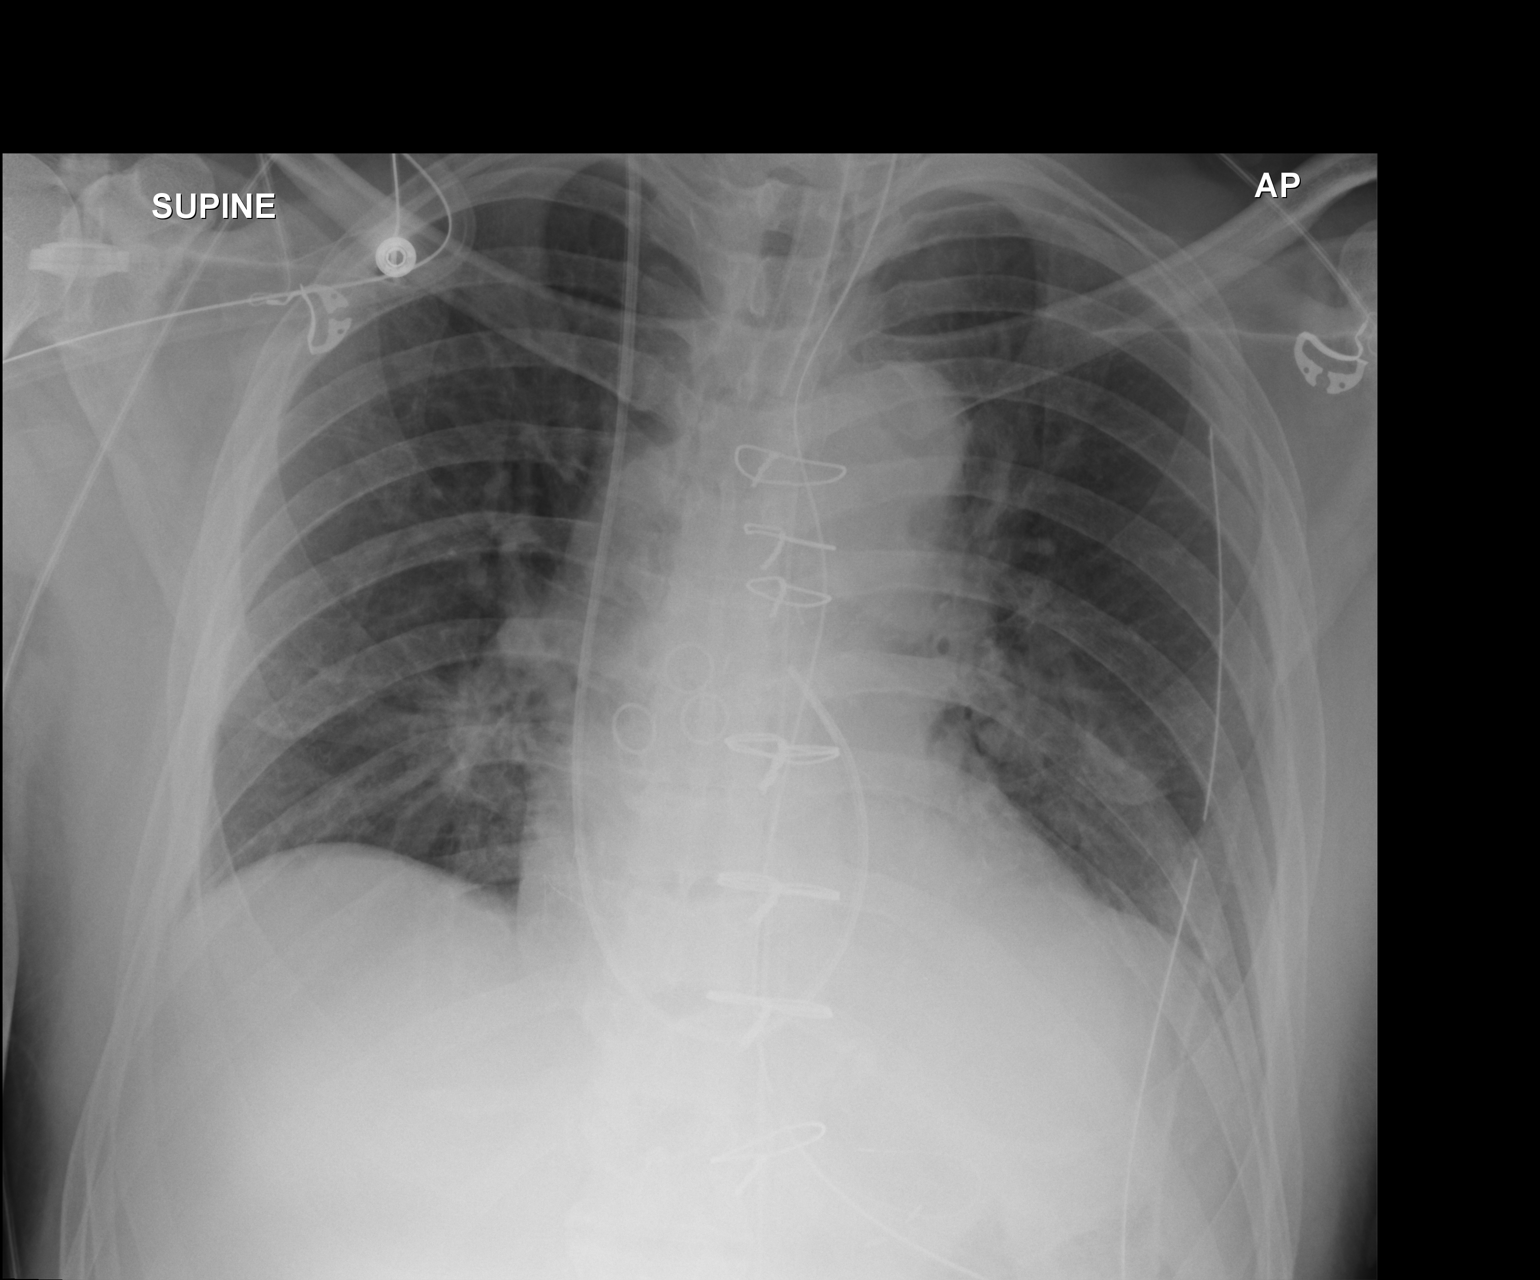

[1 of 1 positions shown; findings below may reference images not displayed]

FINDINGS: Left-sided chest tube in satisfactory position without new
pneumothorax. Endotracheal tube with the tip 5 cm above the carina.
Swan-Ganz catheter with the tip at the right ventricular outflow
tract. Nasogastric tube coursing below the diaphragm.

No focal consolidation or pleural effusion. No pneumothorax. Stable
cardiac silhouette. Interval CABG. No pulmonary edema.
IMPRESSION: 1. Swan-Ganz catheter with the tip at the right ventricular outflow
tract.
2. Endotracheal to with the tip 5 cm above the carina.
3. Left-sided chest tube without a pneumothorax.

## 2015-05-05 IMAGING — CR DG CHEST 2V
2 series · 2 of 2 positions shown · non-contrast
Comparison: Chest radiograph performed 02/10/2014

CLINICAL DATA: Preoperative chest radiograph for CABG.

EXAM:
CHEST  2 VIEW

[w chest pa]
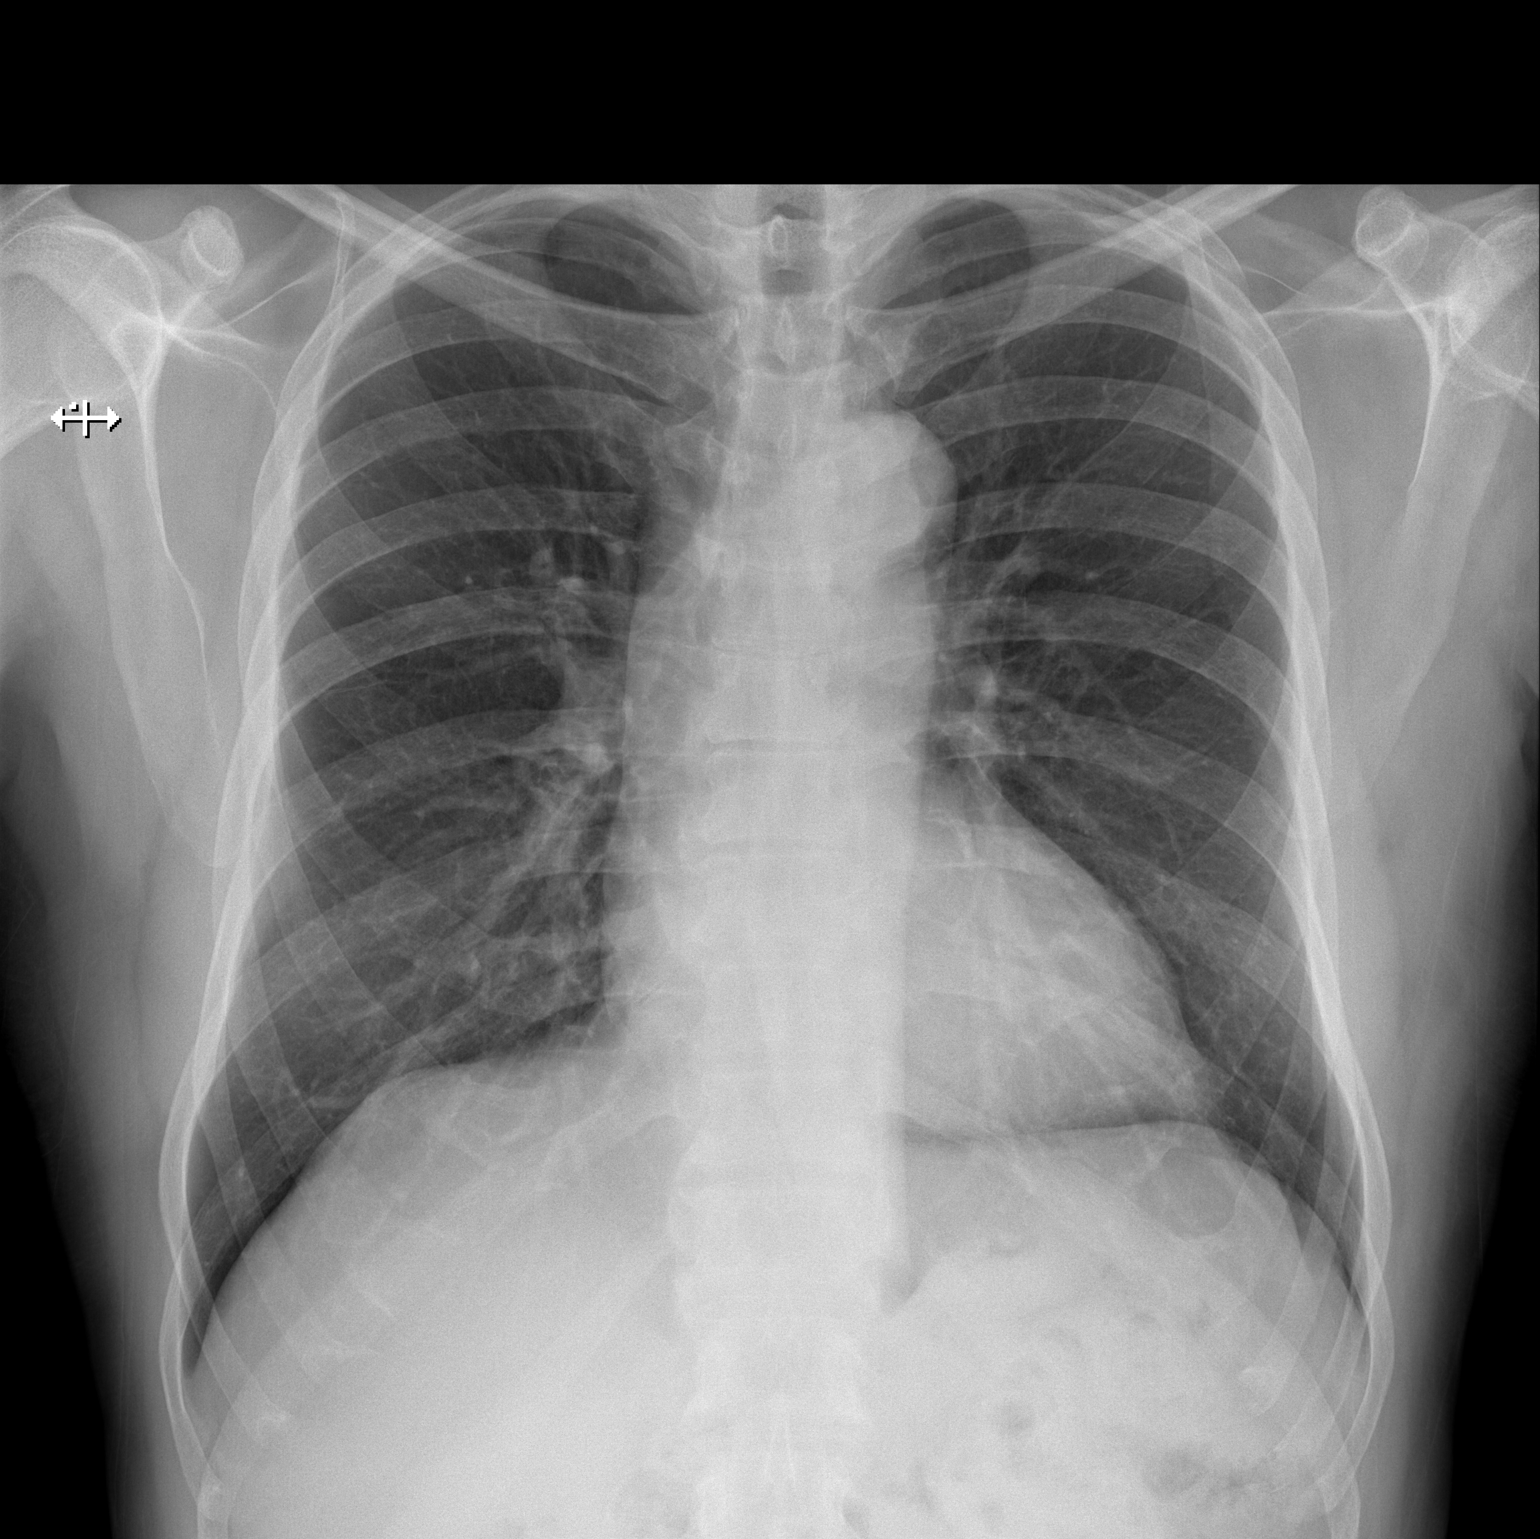

[w chest lat]
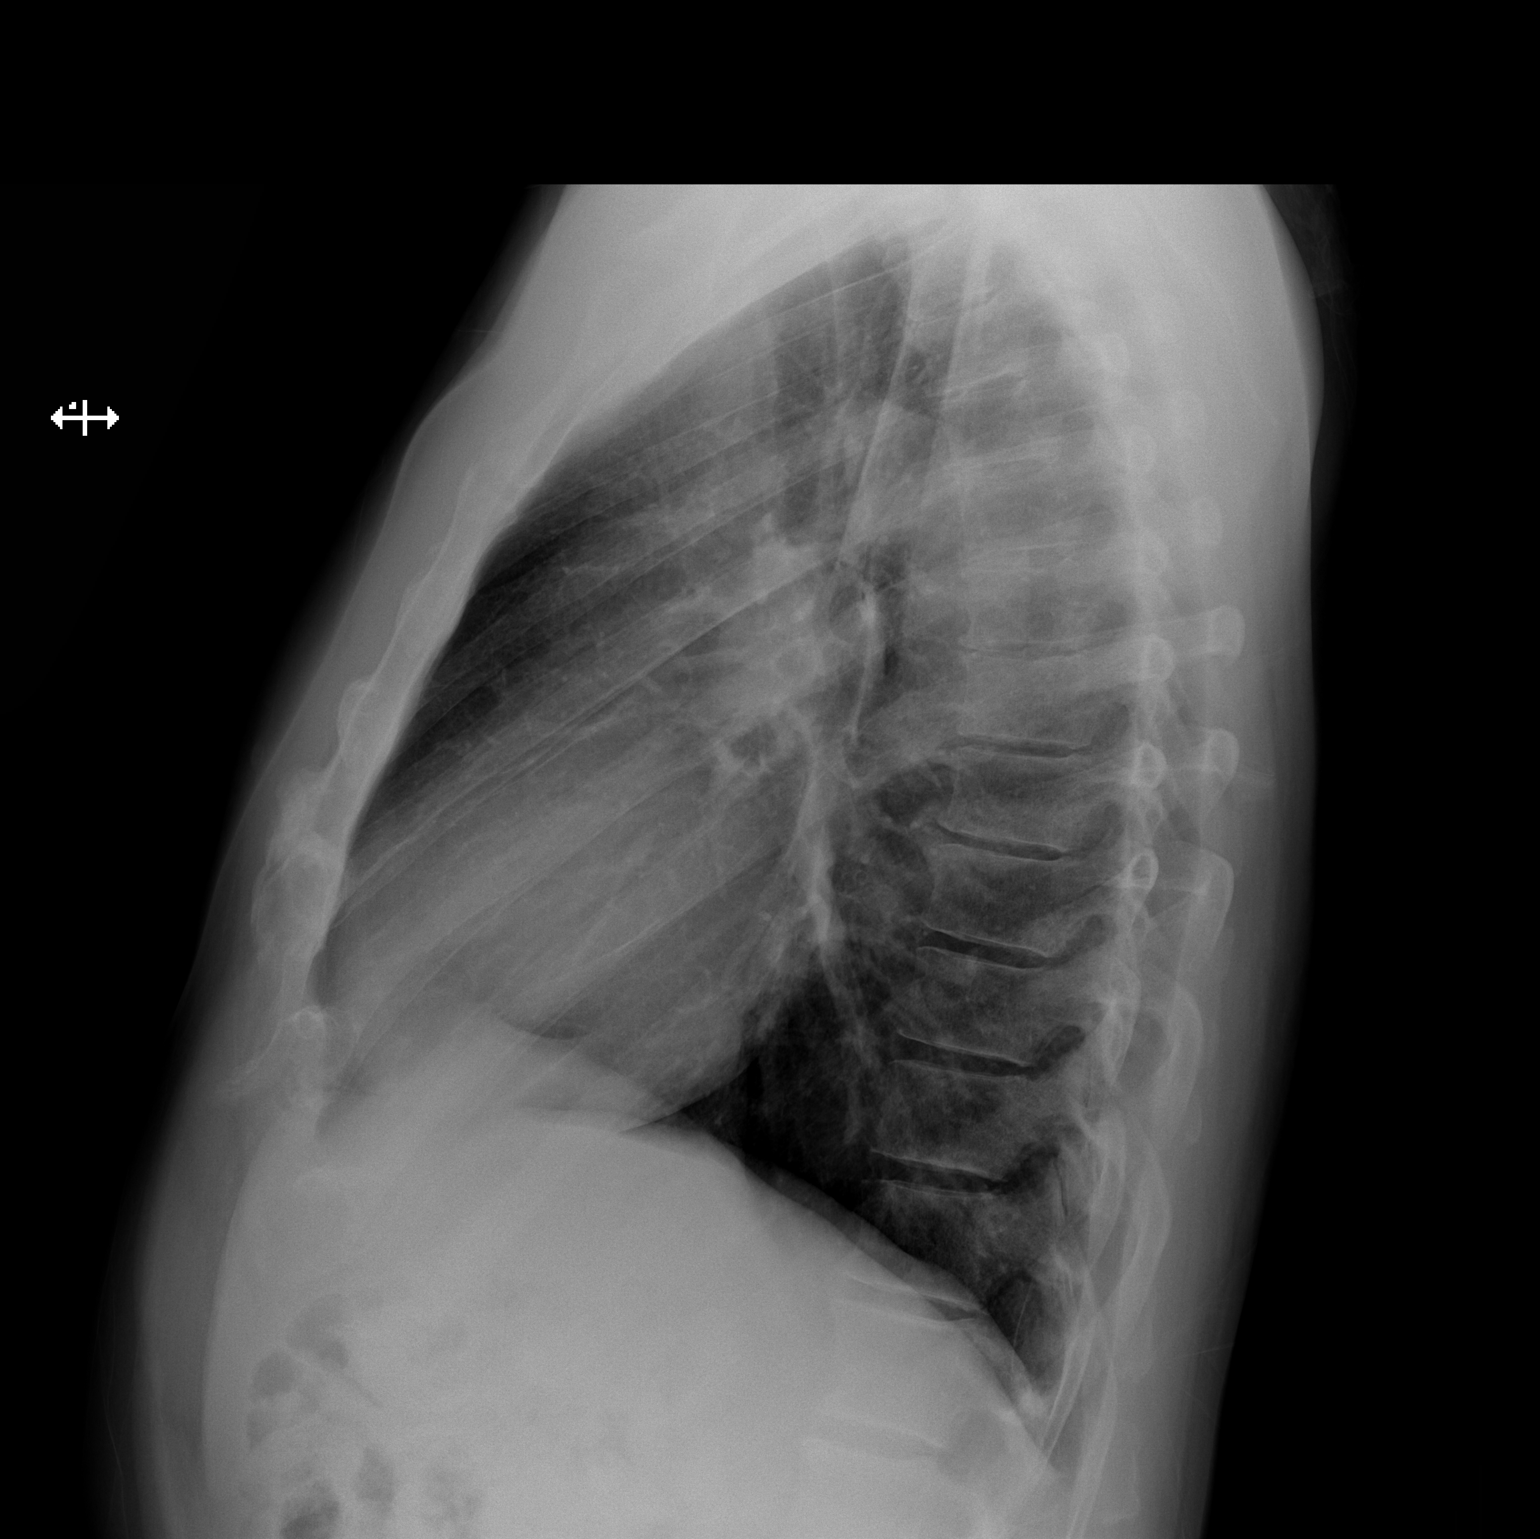

[2 of 2 positions shown; findings below may reference images not displayed]

FINDINGS: The lungs are well-aerated and clear. There is no evidence of focal
opacification, pleural effusion or pneumothorax.

The heart is normal in size; the mediastinal contour is within
normal limits. No acute osseous abnormalities are seen.
IMPRESSION: No acute cardiopulmonary process seen.

## 2015-05-08 IMAGING — CR DG CHEST 2V
2 series · 2 of 2 positions shown · non-contrast
Comparison: 02/24/2014 and earlier.

CLINICAL DATA: 63-year-old male chest pain. Recent CABG. Initial
encounter.

EXAM:
CHEST  2 VIEW

[w chest pa]
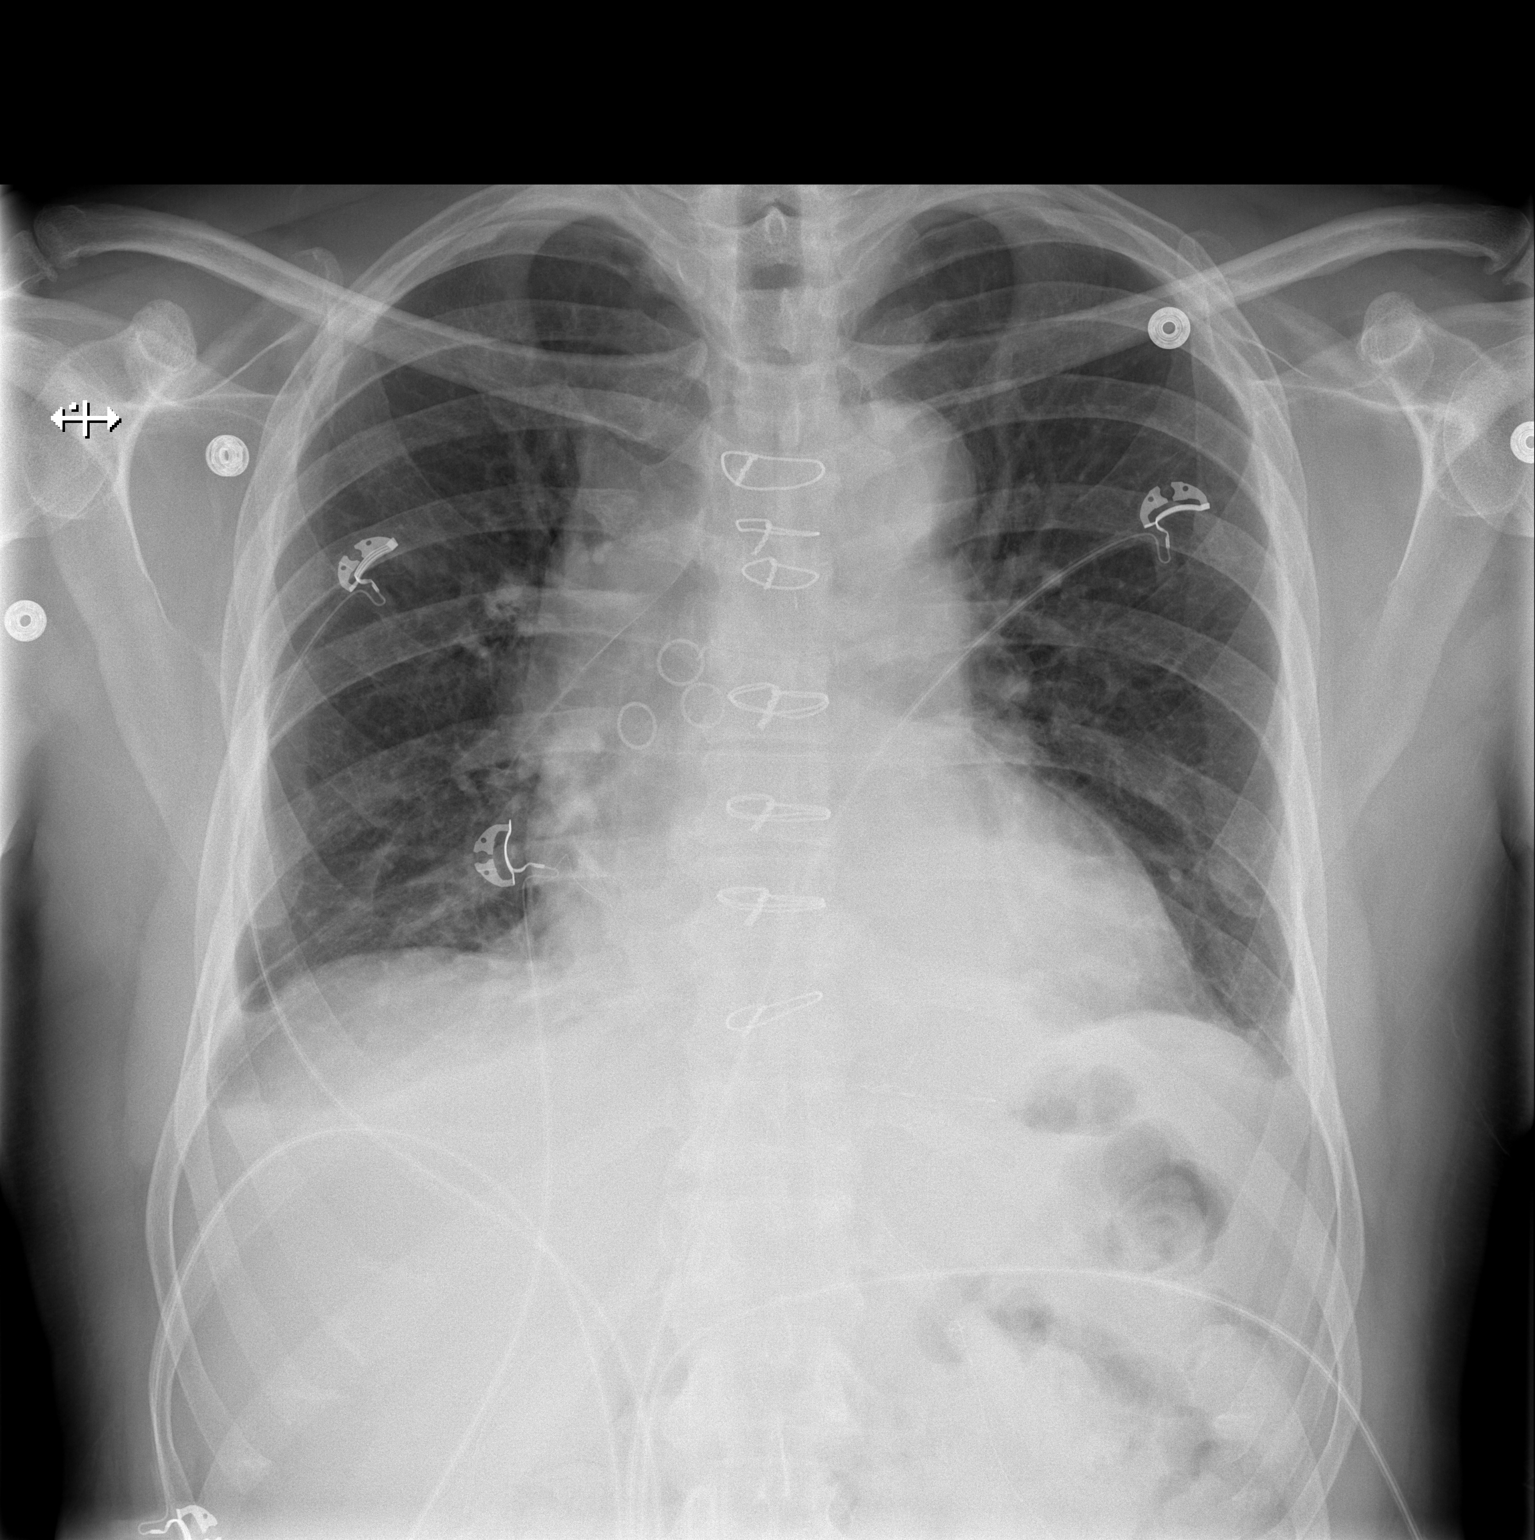

[w chest lat]
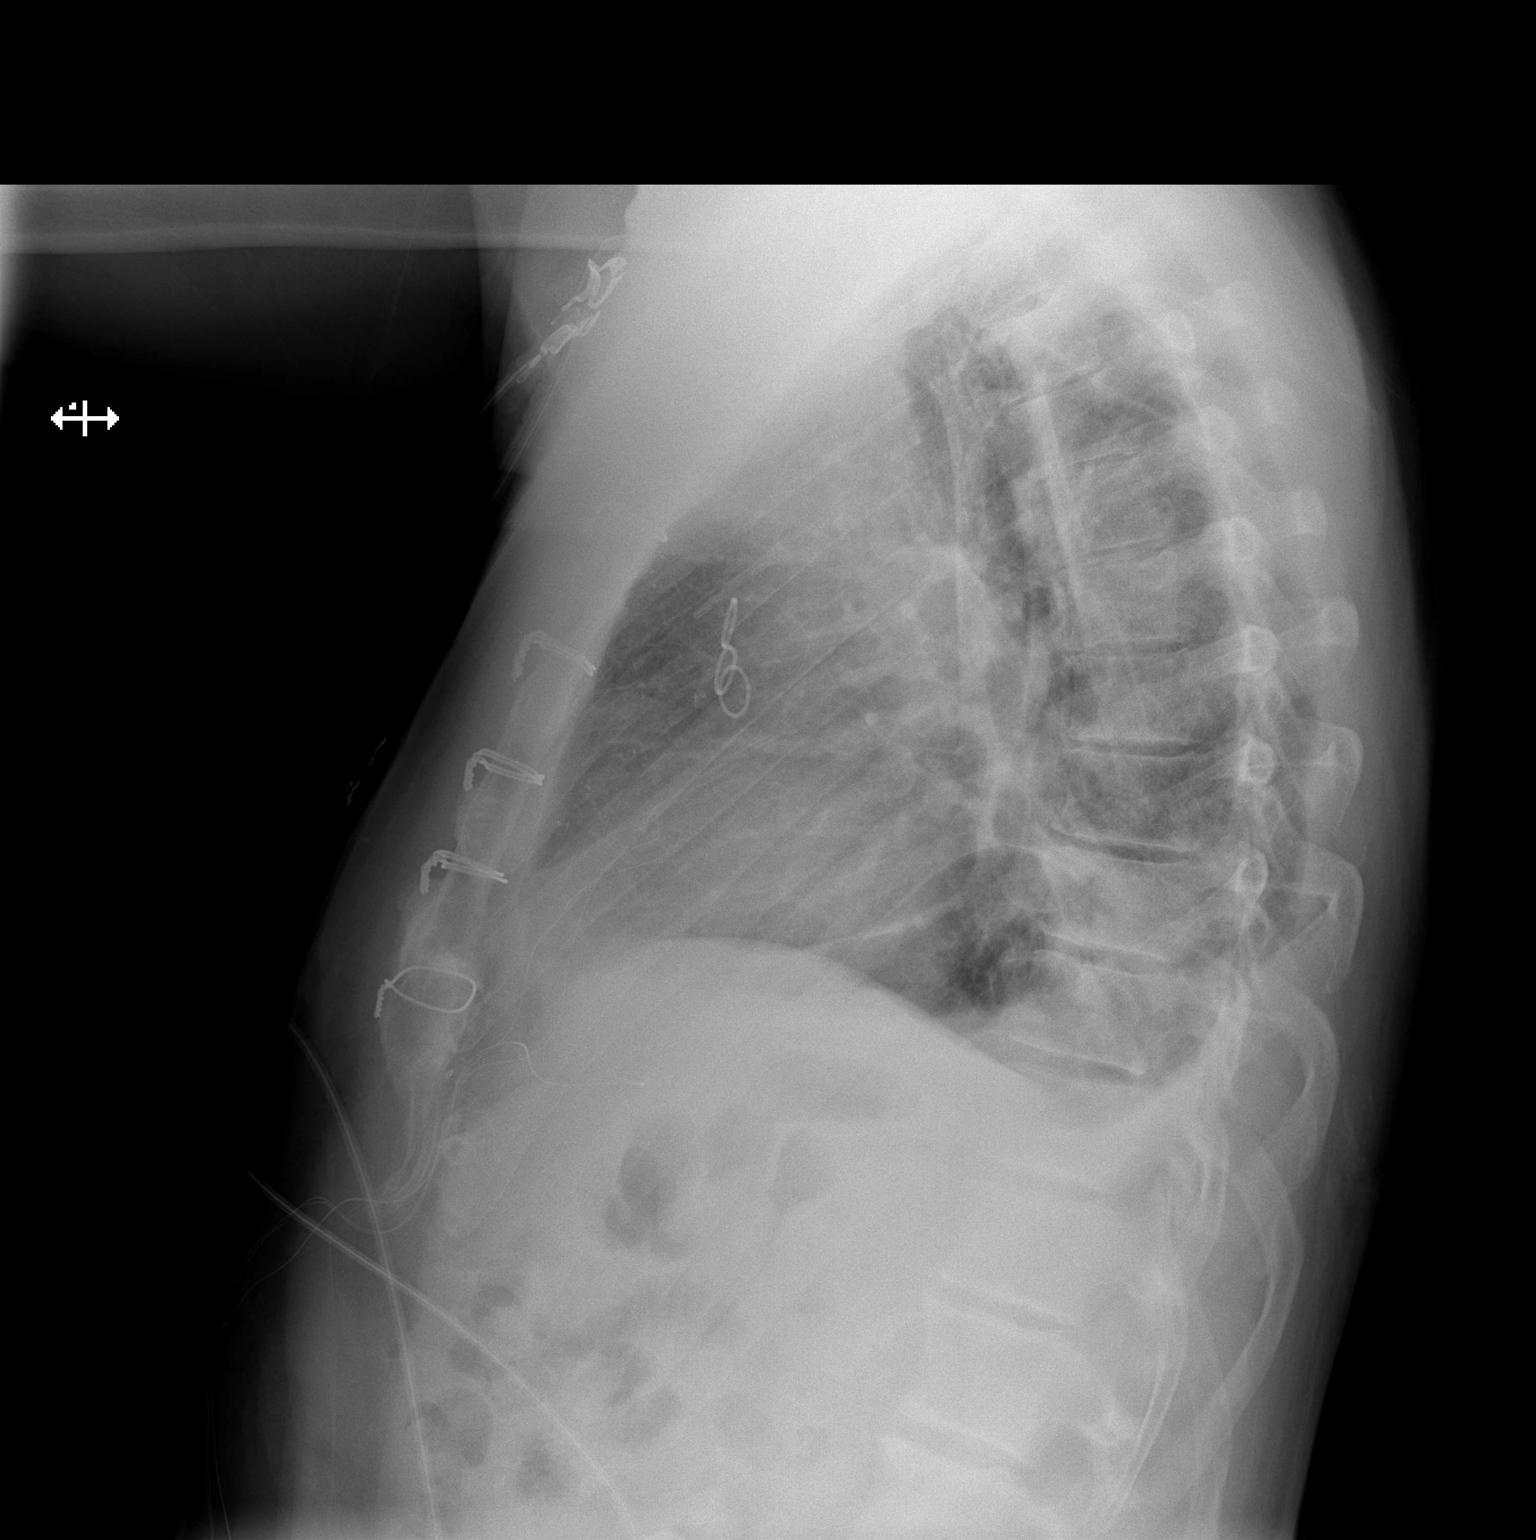

[2 of 2 positions shown; findings below may reference images not displayed]

FINDINGS: Right IJ introducer sheath removed. Stable cardiac size and
mediastinal contours. Visualized tracheal air column is within
normal limits. Sequelae of CABG. No pneumothorax. Small to moderate
bilateral pleural effusions with infrahilar and bibasilar opacity
compatible with atelectasis. No pulmonary edema. Epicardial pacer
wires remain in place. No acute osseous abnormality identified.
IMPRESSION: Lines and tubes removed. Small to moderate bilateral pleural
effusions with atelectasis.

## 2016-07-08 DIAGNOSIS — I2510 Atherosclerotic heart disease of native coronary artery without angina pectoris: Secondary | ICD-10-CM | POA: Diagnosis not present

## 2016-07-08 DIAGNOSIS — Z951 Presence of aortocoronary bypass graft: Secondary | ICD-10-CM | POA: Diagnosis not present

## 2016-07-08 DIAGNOSIS — E785 Hyperlipidemia, unspecified: Secondary | ICD-10-CM | POA: Diagnosis not present

## 2016-09-20 DIAGNOSIS — E785 Hyperlipidemia, unspecified: Secondary | ICD-10-CM | POA: Diagnosis not present

## 2016-09-20 DIAGNOSIS — Z951 Presence of aortocoronary bypass graft: Secondary | ICD-10-CM | POA: Diagnosis not present

## 2016-09-20 DIAGNOSIS — I2510 Atherosclerotic heart disease of native coronary artery without angina pectoris: Secondary | ICD-10-CM | POA: Diagnosis not present

## 2016-09-20 DIAGNOSIS — R002 Palpitations: Secondary | ICD-10-CM | POA: Diagnosis not present

## 2016-09-21 DIAGNOSIS — R002 Palpitations: Secondary | ICD-10-CM | POA: Diagnosis not present

## 2016-09-24 DIAGNOSIS — I2510 Atherosclerotic heart disease of native coronary artery without angina pectoris: Secondary | ICD-10-CM | POA: Diagnosis not present

## 2016-09-24 DIAGNOSIS — E785 Hyperlipidemia, unspecified: Secondary | ICD-10-CM | POA: Diagnosis not present

## 2016-09-24 DIAGNOSIS — R002 Palpitations: Secondary | ICD-10-CM | POA: Diagnosis not present

## 2016-09-24 DIAGNOSIS — Z951 Presence of aortocoronary bypass graft: Secondary | ICD-10-CM | POA: Diagnosis not present

## 2016-09-26 DIAGNOSIS — I2510 Atherosclerotic heart disease of native coronary artery without angina pectoris: Secondary | ICD-10-CM | POA: Diagnosis not present

## 2016-10-04 DIAGNOSIS — I2510 Atherosclerotic heart disease of native coronary artery without angina pectoris: Secondary | ICD-10-CM | POA: Diagnosis not present

## 2016-10-04 DIAGNOSIS — Z951 Presence of aortocoronary bypass graft: Secondary | ICD-10-CM | POA: Diagnosis not present

## 2016-10-04 DIAGNOSIS — I493 Ventricular premature depolarization: Secondary | ICD-10-CM | POA: Diagnosis not present

## 2016-10-04 DIAGNOSIS — R002 Palpitations: Secondary | ICD-10-CM | POA: Diagnosis not present

## 2016-10-04 DIAGNOSIS — E785 Hyperlipidemia, unspecified: Secondary | ICD-10-CM | POA: Diagnosis not present

## 2017-05-04 DIAGNOSIS — B349 Viral infection, unspecified: Secondary | ICD-10-CM | POA: Diagnosis not present

## 2017-05-04 DIAGNOSIS — J0110 Acute frontal sinusitis, unspecified: Secondary | ICD-10-CM | POA: Diagnosis not present

## 2017-07-07 DIAGNOSIS — E7849 Other hyperlipidemia: Secondary | ICD-10-CM | POA: Diagnosis not present

## 2017-07-07 DIAGNOSIS — I422 Other hypertrophic cardiomyopathy: Secondary | ICD-10-CM | POA: Diagnosis not present

## 2017-07-07 DIAGNOSIS — I493 Ventricular premature depolarization: Secondary | ICD-10-CM | POA: Diagnosis not present

## 2017-07-07 DIAGNOSIS — Z951 Presence of aortocoronary bypass graft: Secondary | ICD-10-CM | POA: Diagnosis not present

## 2017-07-07 DIAGNOSIS — I2510 Atherosclerotic heart disease of native coronary artery without angina pectoris: Secondary | ICD-10-CM | POA: Diagnosis not present

## 2018-03-31 DIAGNOSIS — Z125 Encounter for screening for malignant neoplasm of prostate: Secondary | ICD-10-CM | POA: Diagnosis not present

## 2018-03-31 DIAGNOSIS — Z7689 Persons encountering health services in other specified circumstances: Secondary | ICD-10-CM | POA: Diagnosis not present

## 2018-03-31 DIAGNOSIS — I2510 Atherosclerotic heart disease of native coronary artery without angina pectoris: Secondary | ICD-10-CM | POA: Diagnosis not present

## 2018-03-31 DIAGNOSIS — Z951 Presence of aortocoronary bypass graft: Secondary | ICD-10-CM | POA: Diagnosis not present

## 2018-03-31 DIAGNOSIS — E785 Hyperlipidemia, unspecified: Secondary | ICD-10-CM | POA: Diagnosis not present

## 2018-03-31 DIAGNOSIS — R69 Illness, unspecified: Secondary | ICD-10-CM | POA: Diagnosis not present

## 2018-03-31 DIAGNOSIS — G4730 Sleep apnea, unspecified: Secondary | ICD-10-CM | POA: Diagnosis not present

## 2018-05-27 DIAGNOSIS — R0681 Apnea, not elsewhere classified: Secondary | ICD-10-CM | POA: Diagnosis not present

## 2018-06-05 ENCOUNTER — Telehealth: Payer: Self-pay | Admitting: Cardiology

## 2018-06-07 DIAGNOSIS — J329 Chronic sinusitis, unspecified: Secondary | ICD-10-CM | POA: Diagnosis not present

## 2018-06-07 DIAGNOSIS — J209 Acute bronchitis, unspecified: Secondary | ICD-10-CM | POA: Diagnosis not present

## 2018-06-08 NOTE — Telephone Encounter (Signed)
done

## 2018-06-16 DIAGNOSIS — J069 Acute upper respiratory infection, unspecified: Secondary | ICD-10-CM | POA: Diagnosis not present

## 2018-07-10 DIAGNOSIS — Z951 Presence of aortocoronary bypass graft: Secondary | ICD-10-CM | POA: Diagnosis not present

## 2018-07-10 DIAGNOSIS — Z125 Encounter for screening for malignant neoplasm of prostate: Secondary | ICD-10-CM | POA: Diagnosis not present

## 2018-07-10 DIAGNOSIS — Z0000 Encounter for general adult medical examination without abnormal findings: Secondary | ICD-10-CM | POA: Diagnosis not present

## 2018-07-10 DIAGNOSIS — Z1159 Encounter for screening for other viral diseases: Secondary | ICD-10-CM | POA: Diagnosis not present

## 2018-07-10 DIAGNOSIS — I2510 Atherosclerotic heart disease of native coronary artery without angina pectoris: Secondary | ICD-10-CM | POA: Diagnosis not present

## 2018-07-10 DIAGNOSIS — E785 Hyperlipidemia, unspecified: Secondary | ICD-10-CM | POA: Diagnosis not present

## 2018-07-10 DIAGNOSIS — G4730 Sleep apnea, unspecified: Secondary | ICD-10-CM | POA: Diagnosis not present

## 2018-07-22 DIAGNOSIS — Z125 Encounter for screening for malignant neoplasm of prostate: Secondary | ICD-10-CM | POA: Diagnosis not present

## 2018-07-22 DIAGNOSIS — Z1159 Encounter for screening for other viral diseases: Secondary | ICD-10-CM | POA: Diagnosis not present

## 2018-07-22 DIAGNOSIS — Z951 Presence of aortocoronary bypass graft: Secondary | ICD-10-CM | POA: Diagnosis not present

## 2018-07-22 DIAGNOSIS — Z0000 Encounter for general adult medical examination without abnormal findings: Secondary | ICD-10-CM | POA: Diagnosis not present

## 2018-07-22 DIAGNOSIS — I2510 Atherosclerotic heart disease of native coronary artery without angina pectoris: Secondary | ICD-10-CM | POA: Diagnosis not present

## 2018-07-22 DIAGNOSIS — G4730 Sleep apnea, unspecified: Secondary | ICD-10-CM | POA: Diagnosis not present

## 2018-07-22 DIAGNOSIS — E785 Hyperlipidemia, unspecified: Secondary | ICD-10-CM | POA: Diagnosis not present

## 2019-01-29 DIAGNOSIS — R739 Hyperglycemia, unspecified: Secondary | ICD-10-CM | POA: Diagnosis not present

## 2019-01-29 DIAGNOSIS — I2510 Atherosclerotic heart disease of native coronary artery without angina pectoris: Secondary | ICD-10-CM | POA: Diagnosis not present

## 2019-01-29 DIAGNOSIS — H6121 Impacted cerumen, right ear: Secondary | ICD-10-CM | POA: Diagnosis not present

## 2019-01-29 DIAGNOSIS — Z951 Presence of aortocoronary bypass graft: Secondary | ICD-10-CM | POA: Diagnosis not present

## 2019-01-29 DIAGNOSIS — G4730 Sleep apnea, unspecified: Secondary | ICD-10-CM | POA: Diagnosis not present

## 2019-01-29 DIAGNOSIS — B360 Pityriasis versicolor: Secondary | ICD-10-CM | POA: Diagnosis not present

## 2019-01-29 DIAGNOSIS — Z1211 Encounter for screening for malignant neoplasm of colon: Secondary | ICD-10-CM | POA: Diagnosis not present

## 2019-01-29 DIAGNOSIS — E785 Hyperlipidemia, unspecified: Secondary | ICD-10-CM | POA: Diagnosis not present

## 2019-05-03 DIAGNOSIS — I2510 Atherosclerotic heart disease of native coronary artery without angina pectoris: Secondary | ICD-10-CM | POA: Diagnosis not present

## 2019-05-03 DIAGNOSIS — J300 Vasomotor rhinitis: Secondary | ICD-10-CM | POA: Diagnosis not present

## 2019-05-03 DIAGNOSIS — G4730 Sleep apnea, unspecified: Secondary | ICD-10-CM | POA: Diagnosis not present

## 2019-05-03 DIAGNOSIS — R739 Hyperglycemia, unspecified: Secondary | ICD-10-CM | POA: Diagnosis not present

## 2019-05-03 DIAGNOSIS — Z951 Presence of aortocoronary bypass graft: Secondary | ICD-10-CM | POA: Diagnosis not present

## 2019-05-03 DIAGNOSIS — E785 Hyperlipidemia, unspecified: Secondary | ICD-10-CM | POA: Diagnosis not present

## 2019-05-24 DIAGNOSIS — R739 Hyperglycemia, unspecified: Secondary | ICD-10-CM | POA: Diagnosis not present

## 2019-05-24 DIAGNOSIS — G4730 Sleep apnea, unspecified: Secondary | ICD-10-CM | POA: Diagnosis not present

## 2019-05-24 DIAGNOSIS — H6121 Impacted cerumen, right ear: Secondary | ICD-10-CM | POA: Diagnosis not present

## 2019-05-24 DIAGNOSIS — E785 Hyperlipidemia, unspecified: Secondary | ICD-10-CM | POA: Diagnosis not present

## 2019-05-24 DIAGNOSIS — Z1211 Encounter for screening for malignant neoplasm of colon: Secondary | ICD-10-CM | POA: Diagnosis not present

## 2019-05-24 DIAGNOSIS — B360 Pityriasis versicolor: Secondary | ICD-10-CM | POA: Diagnosis not present

## 2019-05-24 DIAGNOSIS — Z951 Presence of aortocoronary bypass graft: Secondary | ICD-10-CM | POA: Diagnosis not present

## 2019-05-24 DIAGNOSIS — I2510 Atherosclerotic heart disease of native coronary artery without angina pectoris: Secondary | ICD-10-CM | POA: Diagnosis not present

## 2020-03-13 DIAGNOSIS — G4730 Sleep apnea, unspecified: Secondary | ICD-10-CM | POA: Diagnosis not present

## 2020-03-13 DIAGNOSIS — Z1211 Encounter for screening for malignant neoplasm of colon: Secondary | ICD-10-CM | POA: Diagnosis not present

## 2020-03-13 DIAGNOSIS — R739 Hyperglycemia, unspecified: Secondary | ICD-10-CM | POA: Diagnosis not present

## 2020-03-13 DIAGNOSIS — I2510 Atherosclerotic heart disease of native coronary artery without angina pectoris: Secondary | ICD-10-CM | POA: Diagnosis not present

## 2020-03-13 DIAGNOSIS — E785 Hyperlipidemia, unspecified: Secondary | ICD-10-CM | POA: Diagnosis not present

## 2020-03-13 DIAGNOSIS — R1013 Epigastric pain: Secondary | ICD-10-CM | POA: Diagnosis not present

## 2020-03-13 DIAGNOSIS — Z951 Presence of aortocoronary bypass graft: Secondary | ICD-10-CM | POA: Diagnosis not present

## 2020-03-13 DIAGNOSIS — W57XXXS Bitten or stung by nonvenomous insect and other nonvenomous arthropods, sequela: Secondary | ICD-10-CM | POA: Diagnosis not present

## 2020-03-13 DIAGNOSIS — K449 Diaphragmatic hernia without obstruction or gangrene: Secondary | ICD-10-CM | POA: Diagnosis not present

## 2020-06-16 DIAGNOSIS — R739 Hyperglycemia, unspecified: Secondary | ICD-10-CM | POA: Diagnosis not present

## 2020-06-16 DIAGNOSIS — R1013 Epigastric pain: Secondary | ICD-10-CM | POA: Diagnosis not present

## 2020-06-16 DIAGNOSIS — Z951 Presence of aortocoronary bypass graft: Secondary | ICD-10-CM | POA: Diagnosis not present

## 2020-06-16 DIAGNOSIS — E785 Hyperlipidemia, unspecified: Secondary | ICD-10-CM | POA: Diagnosis not present

## 2020-06-16 DIAGNOSIS — I2510 Atherosclerotic heart disease of native coronary artery without angina pectoris: Secondary | ICD-10-CM | POA: Diagnosis not present

## 2020-06-16 DIAGNOSIS — K449 Diaphragmatic hernia without obstruction or gangrene: Secondary | ICD-10-CM | POA: Diagnosis not present

## 2020-06-16 DIAGNOSIS — G4730 Sleep apnea, unspecified: Secondary | ICD-10-CM | POA: Diagnosis not present

## 2020-06-16 DIAGNOSIS — Z1211 Encounter for screening for malignant neoplasm of colon: Secondary | ICD-10-CM | POA: Diagnosis not present

## 2020-10-06 DIAGNOSIS — Z1389 Encounter for screening for other disorder: Secondary | ICD-10-CM | POA: Diagnosis not present

## 2020-10-06 DIAGNOSIS — Z0000 Encounter for general adult medical examination without abnormal findings: Secondary | ICD-10-CM | POA: Diagnosis not present

## 2020-10-06 DIAGNOSIS — Z23 Encounter for immunization: Secondary | ICD-10-CM | POA: Diagnosis not present

## 2020-11-28 DIAGNOSIS — E785 Hyperlipidemia, unspecified: Secondary | ICD-10-CM | POA: Diagnosis not present

## 2020-11-28 DIAGNOSIS — B351 Tinea unguium: Secondary | ICD-10-CM | POA: Diagnosis not present

## 2020-11-28 DIAGNOSIS — Z125 Encounter for screening for malignant neoplasm of prostate: Secondary | ICD-10-CM | POA: Diagnosis not present

## 2020-11-28 DIAGNOSIS — L03116 Cellulitis of left lower limb: Secondary | ICD-10-CM | POA: Diagnosis not present

## 2020-11-28 DIAGNOSIS — R739 Hyperglycemia, unspecified: Secondary | ICD-10-CM | POA: Diagnosis not present

## 2020-11-28 DIAGNOSIS — S8012XA Contusion of left lower leg, initial encounter: Secondary | ICD-10-CM | POA: Diagnosis not present

## 2020-11-30 DIAGNOSIS — E785 Hyperlipidemia, unspecified: Secondary | ICD-10-CM | POA: Diagnosis not present

## 2020-11-30 DIAGNOSIS — R739 Hyperglycemia, unspecified: Secondary | ICD-10-CM | POA: Diagnosis not present

## 2020-11-30 DIAGNOSIS — Z1211 Encounter for screening for malignant neoplasm of colon: Secondary | ICD-10-CM | POA: Diagnosis not present

## 2020-11-30 DIAGNOSIS — L03116 Cellulitis of left lower limb: Secondary | ICD-10-CM | POA: Diagnosis not present

## 2020-11-30 DIAGNOSIS — B351 Tinea unguium: Secondary | ICD-10-CM | POA: Diagnosis not present

## 2021-04-02 DIAGNOSIS — H60331 Swimmer's ear, right ear: Secondary | ICD-10-CM | POA: Diagnosis not present

## 2021-04-02 DIAGNOSIS — B351 Tinea unguium: Secondary | ICD-10-CM | POA: Diagnosis not present

## 2021-04-02 DIAGNOSIS — H6691 Otitis media, unspecified, right ear: Secondary | ICD-10-CM | POA: Diagnosis not present

## 2021-04-02 DIAGNOSIS — R739 Hyperglycemia, unspecified: Secondary | ICD-10-CM | POA: Diagnosis not present

## 2021-04-02 DIAGNOSIS — E785 Hyperlipidemia, unspecified: Secondary | ICD-10-CM | POA: Diagnosis not present

## 2021-04-17 DIAGNOSIS — R739 Hyperglycemia, unspecified: Secondary | ICD-10-CM | POA: Diagnosis not present

## 2021-04-17 DIAGNOSIS — B351 Tinea unguium: Secondary | ICD-10-CM | POA: Diagnosis not present

## 2021-04-17 DIAGNOSIS — H6691 Otitis media, unspecified, right ear: Secondary | ICD-10-CM | POA: Diagnosis not present

## 2021-04-17 DIAGNOSIS — H60501 Unspecified acute noninfective otitis externa, right ear: Secondary | ICD-10-CM | POA: Diagnosis not present

## 2021-07-02 DIAGNOSIS — H60311 Diffuse otitis externa, right ear: Secondary | ICD-10-CM | POA: Diagnosis not present

## 2021-12-07 DIAGNOSIS — E785 Hyperlipidemia, unspecified: Secondary | ICD-10-CM | POA: Diagnosis not present

## 2021-12-07 DIAGNOSIS — I10 Essential (primary) hypertension: Secondary | ICD-10-CM | POA: Diagnosis not present

## 2021-12-07 DIAGNOSIS — R739 Hyperglycemia, unspecified: Secondary | ICD-10-CM | POA: Diagnosis not present

## 2021-12-07 DIAGNOSIS — R202 Paresthesia of skin: Secondary | ICD-10-CM | POA: Diagnosis not present

## 2021-12-07 DIAGNOSIS — Z125 Encounter for screening for malignant neoplasm of prostate: Secondary | ICD-10-CM | POA: Diagnosis not present

## 2021-12-07 DIAGNOSIS — Z0001 Encounter for general adult medical examination with abnormal findings: Secondary | ICD-10-CM | POA: Diagnosis not present

## 2021-12-07 DIAGNOSIS — J300 Vasomotor rhinitis: Secondary | ICD-10-CM | POA: Diagnosis not present

## 2021-12-07 DIAGNOSIS — K449 Diaphragmatic hernia without obstruction or gangrene: Secondary | ICD-10-CM | POA: Diagnosis not present

## 2021-12-07 DIAGNOSIS — I2510 Atherosclerotic heart disease of native coronary artery without angina pectoris: Secondary | ICD-10-CM | POA: Diagnosis not present

## 2021-12-07 DIAGNOSIS — Z1211 Encounter for screening for malignant neoplasm of colon: Secondary | ICD-10-CM | POA: Diagnosis not present

## 2021-12-07 DIAGNOSIS — F439 Reaction to severe stress, unspecified: Secondary | ICD-10-CM | POA: Diagnosis not present

## 2021-12-07 DIAGNOSIS — G4730 Sleep apnea, unspecified: Secondary | ICD-10-CM | POA: Diagnosis not present

## 2021-12-07 DIAGNOSIS — B351 Tinea unguium: Secondary | ICD-10-CM | POA: Diagnosis not present

## 2022-01-02 DIAGNOSIS — I10 Essential (primary) hypertension: Secondary | ICD-10-CM | POA: Diagnosis not present

## 2022-01-02 DIAGNOSIS — R739 Hyperglycemia, unspecified: Secondary | ICD-10-CM | POA: Diagnosis not present

## 2022-01-31 DIAGNOSIS — Z23 Encounter for immunization: Secondary | ICD-10-CM | POA: Diagnosis not present

## 2022-01-31 DIAGNOSIS — I10 Essential (primary) hypertension: Secondary | ICD-10-CM | POA: Diagnosis not present

## 2022-01-31 DIAGNOSIS — G608 Other hereditary and idiopathic neuropathies: Secondary | ICD-10-CM | POA: Diagnosis not present

## 2022-01-31 DIAGNOSIS — E785 Hyperlipidemia, unspecified: Secondary | ICD-10-CM | POA: Diagnosis not present

## 2022-01-31 DIAGNOSIS — I2510 Atherosclerotic heart disease of native coronary artery without angina pectoris: Secondary | ICD-10-CM | POA: Diagnosis not present

## 2022-01-31 DIAGNOSIS — H9209 Otalgia, unspecified ear: Secondary | ICD-10-CM | POA: Diagnosis not present

## 2022-01-31 DIAGNOSIS — B351 Tinea unguium: Secondary | ICD-10-CM | POA: Diagnosis not present

## 2022-01-31 DIAGNOSIS — R7303 Prediabetes: Secondary | ICD-10-CM | POA: Diagnosis not present

## 2022-03-08 DIAGNOSIS — H60311 Diffuse otitis externa, right ear: Secondary | ICD-10-CM | POA: Diagnosis not present

## 2022-03-18 DIAGNOSIS — H60311 Diffuse otitis externa, right ear: Secondary | ICD-10-CM | POA: Diagnosis not present

## 2022-08-28 DIAGNOSIS — B351 Tinea unguium: Secondary | ICD-10-CM | POA: Diagnosis not present

## 2022-08-28 DIAGNOSIS — H9201 Otalgia, right ear: Secondary | ICD-10-CM | POA: Diagnosis not present

## 2022-08-28 DIAGNOSIS — I10 Essential (primary) hypertension: Secondary | ICD-10-CM | POA: Diagnosis not present

## 2022-08-28 DIAGNOSIS — G608 Other hereditary and idiopathic neuropathies: Secondary | ICD-10-CM | POA: Diagnosis not present

## 2022-08-28 DIAGNOSIS — R69 Illness, unspecified: Secondary | ICD-10-CM | POA: Diagnosis not present

## 2022-08-28 DIAGNOSIS — R7303 Prediabetes: Secondary | ICD-10-CM | POA: Diagnosis not present

## 2022-08-28 DIAGNOSIS — E785 Hyperlipidemia, unspecified: Secondary | ICD-10-CM | POA: Diagnosis not present

## 2022-08-28 DIAGNOSIS — I2510 Atherosclerotic heart disease of native coronary artery without angina pectoris: Secondary | ICD-10-CM | POA: Diagnosis not present

## 2022-08-28 DIAGNOSIS — R7309 Other abnormal glucose: Secondary | ICD-10-CM | POA: Diagnosis not present

## 2022-08-28 DIAGNOSIS — Z6824 Body mass index (BMI) 24.0-24.9, adult: Secondary | ICD-10-CM | POA: Diagnosis not present

## 2022-08-28 DIAGNOSIS — Z9189 Other specified personal risk factors, not elsewhere classified: Secondary | ICD-10-CM | POA: Diagnosis not present

## 2022-09-26 DIAGNOSIS — Z6824 Body mass index (BMI) 24.0-24.9, adult: Secondary | ICD-10-CM | POA: Diagnosis not present

## 2022-09-26 DIAGNOSIS — N5201 Erectile dysfunction due to arterial insufficiency: Secondary | ICD-10-CM | POA: Diagnosis not present

## 2022-10-03 DIAGNOSIS — H60391 Other infective otitis externa, right ear: Secondary | ICD-10-CM | POA: Diagnosis not present

## 2022-10-03 DIAGNOSIS — H6121 Impacted cerumen, right ear: Secondary | ICD-10-CM | POA: Diagnosis not present

## 2022-12-11 DIAGNOSIS — Z0000 Encounter for general adult medical examination without abnormal findings: Secondary | ICD-10-CM | POA: Diagnosis not present

## 2022-12-11 DIAGNOSIS — Z1331 Encounter for screening for depression: Secondary | ICD-10-CM | POA: Diagnosis not present

## 2022-12-11 DIAGNOSIS — Z6824 Body mass index (BMI) 24.0-24.9, adult: Secondary | ICD-10-CM | POA: Diagnosis not present

## 2023-02-26 DIAGNOSIS — G608 Other hereditary and idiopathic neuropathies: Secondary | ICD-10-CM | POA: Diagnosis not present

## 2023-02-26 DIAGNOSIS — Z6824 Body mass index (BMI) 24.0-24.9, adult: Secondary | ICD-10-CM | POA: Diagnosis not present

## 2023-02-26 DIAGNOSIS — I2510 Atherosclerotic heart disease of native coronary artery without angina pectoris: Secondary | ICD-10-CM | POA: Diagnosis not present

## 2023-02-26 DIAGNOSIS — G4730 Sleep apnea, unspecified: Secondary | ICD-10-CM | POA: Diagnosis not present

## 2023-02-26 DIAGNOSIS — K449 Diaphragmatic hernia without obstruction or gangrene: Secondary | ICD-10-CM | POA: Diagnosis not present

## 2023-02-26 DIAGNOSIS — Z125 Encounter for screening for malignant neoplasm of prostate: Secondary | ICD-10-CM | POA: Diagnosis not present

## 2023-02-26 DIAGNOSIS — I10 Essential (primary) hypertension: Secondary | ICD-10-CM | POA: Diagnosis not present

## 2023-02-26 DIAGNOSIS — E559 Vitamin D deficiency, unspecified: Secondary | ICD-10-CM | POA: Diagnosis not present

## 2023-02-26 DIAGNOSIS — R7303 Prediabetes: Secondary | ICD-10-CM | POA: Diagnosis not present

## 2023-02-26 DIAGNOSIS — Z23 Encounter for immunization: Secondary | ICD-10-CM | POA: Diagnosis not present

## 2023-02-26 DIAGNOSIS — E785 Hyperlipidemia, unspecified: Secondary | ICD-10-CM | POA: Diagnosis not present

## 2023-03-24 DIAGNOSIS — Z6824 Body mass index (BMI) 24.0-24.9, adult: Secondary | ICD-10-CM | POA: Diagnosis not present

## 2023-03-24 DIAGNOSIS — I2510 Atherosclerotic heart disease of native coronary artery without angina pectoris: Secondary | ICD-10-CM | POA: Diagnosis not present

## 2023-03-24 DIAGNOSIS — Z79899 Other long term (current) drug therapy: Secondary | ICD-10-CM | POA: Diagnosis not present

## 2023-03-24 DIAGNOSIS — Z951 Presence of aortocoronary bypass graft: Secondary | ICD-10-CM | POA: Diagnosis not present

## 2023-03-24 DIAGNOSIS — I77810 Thoracic aortic ectasia: Secondary | ICD-10-CM | POA: Diagnosis not present

## 2023-03-24 DIAGNOSIS — I10 Essential (primary) hypertension: Secondary | ICD-10-CM | POA: Diagnosis not present

## 2023-04-14 DIAGNOSIS — Z79899 Other long term (current) drug therapy: Secondary | ICD-10-CM | POA: Diagnosis not present

## 2023-05-07 ENCOUNTER — Ambulatory Visit: Payer: Medicare HMO | Attending: Cardiology | Admitting: Cardiology

## 2023-05-07 NOTE — Progress Notes (Unsigned)
  Cardiology Office Note:  .   Date:  05/07/2023  ID:  Ryan Douglas, DOB 04/26/1951, MRN [id number] PCP: Ryan Ladd, MD (Inactive)  [facility] Providers Cardiologist:  Ryan Mainland, MD PCP: Ryan Ladd, MD (Inactive)  No chief complaint on file.  History of Present Illness: .   Ryan Douglas is a 72 y.o. male with ***, hyperlipidemia, CAD s/p CABGX4 (LIMA-LAD, SVG-OM, SVG-RCA, SVG-diagonal)   Patient was previously followed by cardiologist Dr. Donnie Douglas, last seen in 2015.    There were no vitals filed for this visit.   ROS: *** ROS   Studies Reviewed: Marland Kitchen       Labs 08/2022, 04/2023 Cr 0.9 Chol 174, TG 73, HDL 71, LDL 89  *** Risk Assessment/Calculations:   {Does this patient have ATRIAL FIBRILLATION?:[phone]}   Physical Exam:   Physical Exam   VISIT DIAGNOSES: No diagnosis found.   ASSESSMENT AND PLAN: .    Ryan Douglas is a 72 y.o. male with ***, hyperlipidemia, CAD s/p CABGX4 (LIMA-LAD, SVG-OM, SVG-RCA, SVG-diagonal)  {Are you ordering a CV Procedure (e.g. stress test, cath, DCCV, TEE, etc)?   Press F2        :[id number]}    No orders of the defined types were placed in this encounter.    F/u in ***  Signed, Ryan Negus, MD

## 2023-07-01 DIAGNOSIS — Z23 Encounter for immunization: Secondary | ICD-10-CM | POA: Diagnosis not present

## 2023-07-24 ENCOUNTER — Ambulatory Visit: Payer: Medicare HMO | Admitting: Cardiology

## 2023-07-24 NOTE — Progress Notes (Deleted)
  Cardiology Office Note:  .   Date:  07/24/2023  ID:  Ryan Douglas, DOB November 18, 1950, MRN [id number] PCP: Ryan Dimitri, MD  [facility] Providers Cardiologist:  None { Click to update primary MD,subspecialty MD or APP then REFRESH:1}  History of Present Illness: .   Ryan Douglas is a 72 y.o. Caucasian male patient with hypertension, hypercholesterolemia, coronary artery disease SP CABG on 02/22/2014 with LIMA to LAD, SVG to D1, SVG to OM, SVG to RCA referred to reestablish cardiac care as he has not seen a cardiologist in several years.  Discussed the use of AI scribe software for clinical note transcription with the patient, who gave verbal consent to proceed.  History of Present Illness            ROS  Labs    External Labs:  Labs 02/26/2023:  Serum glucose 85 mg, BUN 19, creatinine 0.92, EGFR 88 mL, potassium 4.5, LFTs normal.  Hb 14.0/HCT 42.1, platelets 256.  Vitamin D 34.8.    Physical Exam:   VS:  There were no vitals taken for this visit.   Wt Readings from Last 3 Encounters:  06/09/14 175 lb (79.4 kg)  05/05/14 175 lb (79.4 kg)  03/31/14 176 lb 8 oz (80.1 kg)     Physical Exam  Studies Reviewed: Marland Kitchen    Coronary angiogram 02/17/2014: LM: Mild irregularities. LAD: Proximal 90 to 95% stenosis, apical LAD 95% stenosis.  70% stenosis D1. CX: Severe proximal stenosis and then totally occluded.  Collaterals from the LAD. RCA: Severe proximal 99% stenosis, distal 70 to 80% stenosis.  Collaterals to CX. LVEF 60%.  SP CABG on 02/22/2014 with LIMA to LAD, SVG to D1, SVG to OM, SVG to RCA  EKG:         *** Medications and allergies    Allergies  Allergen Reactions   Morphine And Codeine     Headache   Oxycodone     Headache     Current Outpatient Medications:    aspirin EC 325 MG EC tablet, Take 1 tablet (325 mg total) by mouth daily., Disp: 30 tablet, Rfl: 0   metoprolol tartrate (LOPRESSOR) 25 MG tablet, Take 1 tablet (25 mg total) by  mouth 2 (two) times daily., Disp: 60 tablet, Rfl: 1   Multiple Vitamin (MULTIVITAMIN WITH MINERALS) TABS tablet, Take 1 tablet by mouth daily., Disp: , Rfl:    polyethylene glycol (MIRALAX / GLYCOLAX) packet, Take 17 g by mouth daily as needed., Disp: , Rfl:    rosuvastatin (CRESTOR) 10 MG tablet, Take 10 mg by mouth daily., Disp: , Rfl:    ASSESSMENT AND PLAN: .      ICD-10-CM   1. Coronary artery disease involving native coronary artery of native heart without angina pectoris  I25.10     2. Pure hypercholesterolemia  E78.00     3. Primary hypertension  I10       1. Coronary artery disease involving native coronary artery of native heart without angina pectoris ***  2. Pure hypercholesterolemia ***  3. Primary hypertension ***  Assessment and Plan                {Are you ordering a CV Procedure (e.g. stress test, cath, DCCV, TEE, etc)?   Press F2        :[id number]}   Signed,  Yates Decamp, MD, [facility] 07/24/2023, 2:28 PM [facility] [address] [place], [place] [address] Phone: [phone]. Fax:  [phone]

## 2023-08-12 DIAGNOSIS — R058 Other specified cough: Secondary | ICD-10-CM | POA: Diagnosis not present

## 2023-08-12 DIAGNOSIS — J208 Acute bronchitis due to other specified organisms: Secondary | ICD-10-CM | POA: Diagnosis not present

## 2023-08-12 DIAGNOSIS — H918X3 Other specified hearing loss, bilateral: Secondary | ICD-10-CM | POA: Diagnosis not present

## 2023-08-13 DIAGNOSIS — R053 Chronic cough: Secondary | ICD-10-CM | POA: Diagnosis not present

## 2023-08-13 DIAGNOSIS — H93293 Other abnormal auditory perceptions, bilateral: Secondary | ICD-10-CM | POA: Diagnosis not present

## 2023-08-19 DIAGNOSIS — H903 Sensorineural hearing loss, bilateral: Secondary | ICD-10-CM | POA: Diagnosis not present

## 2023-09-22 DIAGNOSIS — R059 Cough, unspecified: Secondary | ICD-10-CM | POA: Diagnosis not present

## 2023-09-22 DIAGNOSIS — Z6826 Body mass index (BMI) 26.0-26.9, adult: Secondary | ICD-10-CM | POA: Diagnosis not present

## 2023-09-30 ENCOUNTER — Encounter: Payer: Self-pay | Admitting: Cardiology

## 2023-09-30 ENCOUNTER — Ambulatory Visit: Payer: Medicare HMO | Attending: Cardiology | Admitting: Cardiology

## 2023-09-30 VITALS — BP 143/80 | HR 65 | Resp 16 | Ht 71.0 in | Wt 195.6 lb

## 2023-09-30 DIAGNOSIS — E7800 Pure hypercholesterolemia, unspecified: Secondary | ICD-10-CM | POA: Diagnosis not present

## 2023-09-30 DIAGNOSIS — Z951 Presence of aortocoronary bypass graft: Secondary | ICD-10-CM | POA: Diagnosis not present

## 2023-09-30 DIAGNOSIS — I10 Essential (primary) hypertension: Secondary | ICD-10-CM

## 2023-09-30 DIAGNOSIS — I2510 Atherosclerotic heart disease of native coronary artery without angina pectoris: Secondary | ICD-10-CM

## 2023-09-30 DIAGNOSIS — R0989 Other specified symptoms and signs involving the circulatory and respiratory systems: Secondary | ICD-10-CM

## 2023-09-30 MED ORDER — OLMESARTAN MEDOXOMIL-HCTZ 40-12.5 MG PO TABS: 1.0000 | ORAL_TABLET | ORAL | 2 refills | Status: DC

## 2023-09-30 NOTE — Patient Instructions (Signed)
 Medication Instructions:  Stop losartan Start Olmesartan hydrochlorothiazide 40/12.5 mg by mouth daily   *If you need a refill on your cardiac medications before your next appointment, please call your pharmacy*   Lab Work: None  If you have labs (blood work) drawn today and your tests are completely normal, you will receive your results only by: MyChart Message (if you have MyChart) OR A paper copy in the mail If you have any lab test that is abnormal or we need to change your treatment, we will call you to review the results.   Testing/Procedures: Your physician has requested that you have a carotid duplex. This test is an ultrasound of the carotid arteries in your neck. It looks at blood flow through these arteries that supply the brain with blood. Allow one hour for this exam. There are no restrictions or special instructions.  Your physician has requested that you have an echocardiogram. Echocardiography is a painless test that uses sound waves to create images of your heart. It provides your doctor with information about the size and shape of your heart and how well your heart's chambers and valves are working. This procedure takes approximately one hour. There are no restrictions for this procedure. Please do NOT wear cologne, perfume, aftershave, or lotions (deodorant is allowed). Please arrive 15 minutes prior to your appointment time.  Please note: We ask at that you not bring children with you during ultrasound (echo/ vascular) testing. Due to room size and safety concerns, children are not allowed in the ultrasound rooms during exams. Our front office staff cannot provide observation of children in our lobby area while testing is being conducted. An adult accompanying a patient to their appointment will only be allowed in the ultrasound room at the discretion of the ultrasound technician under special circumstances. We apologize for any inconvenience.    Follow-Up: At [facility], you and your health needs are our priority.  As part of our continuing mission to provide you with exceptional heart care, we have created designated Provider Care Teams.  These Care Teams include your primary Cardiologist (physician) and Advanced Practice Providers (APPs -  Physician Assistants and Nurse Practitioners) who all work together to provide you with the care you need, when you need it.  We recommend signing up for the patient portal called "MyChart".  Sign up information is provided on this After Visit Summary.  MyChart is used to connect with patients for Virtual Visits (Telemedicine).  Patients are able to view lab/test results, encounter notes, upcoming appointments, etc.  Non-urgent messages can be sent to your provider as well.   To learn more about what you can do with MyChart, go to [email or web address].    Your next appointment:   8 - 10week(s)  Provider:   Yates Decamp, MD     Other Instructions

## 2023-09-30 NOTE — Progress Notes (Signed)
 Cardiology Office Note:  .   Date:  09/30/2023  ID:  Ryan Douglas, DOB 1950-11-06, MRN [id number] PCP: Gweneth Dimitri, MD  [facility] Providers Cardiologist:  Yates Decamp, MD   History of Present Illness: .   Ryan Douglas is a 73 y.o. Patient with coronary disease SP CABG x 4 on 02/22/2014, primary hypertension & hypercholesterolemia presents to establish care.    Discussed the use of AI scribe software for clinical note transcription with the patient, who gave verbal consent to proceed.  History of Present Illness   Ryan Douglas, a patient with a history of bypass surgery in 2015, was referred by his primary care physician for a cardiology evaluation. He has not seen a cardiologist since his bypass surgery, primarily due to his previous cardiologist's retirement. The patient reports no specific cardiovascular concerns but mentions dealing with hearing issues, for which he has seen several ENT specialists without relief. He also reports that his blood pressure tends to run a little high, with readings around 133/79, but occasionally spiking to 171/86. He does not check his blood pressure daily.  The patient's cholesterol was last checked in January 2024, and he is due for another blood work appointment with his primary care physician. He is currently on atorvastatin 40mg  daily for cholesterol management. The patient admits he could be more physically active. He walks a lot but has not been to the gym recently. He used to be more active before starting a relationship with a less active partner. He quit smoking in 1994 after a 30-year habit of a pack a day.     Labs   External Labs:  PCP labs 02/26/2023:  Serum glucose 85 mg, BUN 19, creatinine 0.92, EGFR 88 mL, potassium 4.5, LFTs normal.  Hb 14.0/HCT 42.1, platelets 256.  Vitamin D 34.8.  PCP labs 08/28/2022:  Total cholesterol 1174, triglycerides 73, HDL 71, LDL 89.  Review of Systems  Cardiovascular:  Negative for  chest pain, dyspnea on exertion and leg swelling.   Physical Exam:   VS:  BP (!) 143/80 (BP Location: Left Arm, Patient Position: Sitting, Cuff Size: Normal)   Pulse 65   Resp 16   Ht 5\' 11"  (1.803 m)   Wt 195 lb 9.6 oz (88.7 kg)   SpO2 98%   BMI 27.28 kg/m    Wt Readings from Last 3 Encounters:  09/30/23 195 lb 9.6 oz (88.7 kg)  06/09/14 175 lb (79.4 kg)  05/05/14 175 lb (79.4 kg)    Physical Exam Neck:     Vascular: Carotid bruit (bilateral) present. No JVD.  Cardiovascular:     Rate and Rhythm: Normal rate and regular rhythm.     Pulses: Intact distal pulses.     Heart sounds: Normal heart sounds. No murmur heard.    No gallop.  Pulmonary:     Effort: Pulmonary effort is normal.     Breath sounds: Normal breath sounds.  Abdominal:     General: Bowel sounds are normal.     Palpations: Abdomen is soft.  Musculoskeletal:     Right lower leg: No edema.     Left lower leg: No edema.    Studies Reviewed: .    Cardiac Cath 02/17/2014:   Normal Left main, 95% stenosis proximal LAD, 70% stenosis ostial Diag 1, 95% stenosis proximal CFX, subtotal occlusion CFX, subtotal occlusion OM 1 OM 2, 95% stenosis proximal RCA;  LVEF of 60%   EKG:    EKG Interpretation Date/Time:  Tuesday  September 30 2023 15:13:08 EST Ventricular Rate:  64 PR Interval:  234 QRS Duration:  74 QT Interval:  384 QTC Calculation: 396 R Axis:   1  Text Interpretation: EKG 09/30/2023: Sinus rhythm with first-degree AV block at rate of 64 bpm, incomplete right bundle branch block.  Otherwise normal EKG.  Compared to 05/02/2014, first-degree block is new and acute inferior/posterior STEMI changes not present. Confirmed by Delrae Rend [phone]) on 09/30/2023 3:19:58 PM    Medications and allergies    Allergies  Allergen Reactions   Morphine And Codeine     Headache   Oxycodone     Headache     Current Outpatient Medications:    aspirin 81 MG chewable tablet, Chew 81 mg by mouth in the morning  and at bedtime., Disp: , Rfl:    atorvastatin (LIPITOR) 40 MG tablet, Take 40 mg by mouth daily., Disp: , Rfl:    metoprolol tartrate (LOPRESSOR) 25 MG tablet, Take 1 tablet (25 mg total) by mouth 2 (two) times daily., Disp: 60 tablet, Rfl: 1   Multiple Vitamin (MULTIVITAMIN WITH MINERALS) TABS tablet, Take 1 tablet by mouth daily., Disp: , Rfl:    olmesartan-hydrochlorothiazide (BENICAR HCT) 40-12.5 MG tablet, Take 1 tablet by mouth every morning., Disp: 30 tablet, Rfl: 2   polyethylene glycol (MIRALAX / GLYCOLAX) packet, Take 17 g by mouth daily as needed., Disp: , Rfl:    tadalafil (CIALIS) 10 MG tablet, Take 10 mg by mouth daily as needed for erectile dysfunction., Disp: , Rfl:    ASSESSMENT AND PLAN: .      ICD-10-CM   1. Coronary artery disease involving native coronary artery of native heart without angina pectoris  I25.10 EKG 12-Lead    ECHOCARDIOGRAM COMPLETE    2. Hx of CABG x 4 02/22/2014: LIMA to LAD, SVG to D1, SVG to OM 2 and SVG to distal RCA  Z95.1 ECHOCARDIOGRAM COMPLETE    3. Pure hypercholesterolemia  E78.00     4. Primary hypertension  I10 olmesartan-hydrochlorothiazide (BENICAR HCT) 40-12.5 MG tablet    5. Bilateral carotid bruits  R09.89 VAS US CAROTID      Assessment and Plan    Coronary Artery Disease (CAD) status post-bypass surgery   Following bypass surgery in 2015, there is no current chest pain or dyspnea, and the EKG is normal. No stress test is needed due to the absence of symptoms. Emphasized the importance of managing blood pressure and cholesterol to prevent future events. Continue current medications, order an echocardiogram and carotid duplex, and follow up in 8-9 weeks.  Primary Hypertension   Blood pressure remains elevated at 143/80, peaking at 171/86, despite current treatment with metoprolol tartrate 25 mg BID and losartan 25 mg QD. Plan to switch to omisartan HCT 40/12.5 mg QAM for better control. Explained the diuretic effect of omisartan HCT  and advised sipping water to manage increased urination. Aim for BP = 130/80. Discontinue losartan, initiate omisartan HCT, and monitor blood pressure and fluid status. Follow up in 8-9 weeks.  He needs BMP in 3 to 4 weeks after starting medication, he is getting labs done next month with his PCP.  Hypercholesterolemia   Total cholesterol is 197, LDL 89, HDL 71, and triglycerides 73. LDL is not at the target of <70 for post-bypass patients. Currently on atorvastatin 40 mg QD. Discussed adding ezetimibe to lower LDL by 20-25%, but he prefers to wait for upcoming blood work. Obtain blood work results from Dr. Uvaldo Rising and consider ezetimibe  if LDL remains elevated. Follow up in 8-9 weeks.  Carotid Bruit   Bilateral carotid bruit was detected in the neck arteries, indicating a need to evaluate for potential plaque buildup. Order a carotid duplex ultrasound.  General Health Maintenance   A former smoker who quit in 1994 after 30 years, he engages in regular walking but needs to increase physical activity. Encourage increased physical activity and continue baby aspirin 81 mg daily.  Follow-up   Schedule a follow-up in 8-9 weeks and ensure he brings lab results from Dr. Uvaldo Rising to the next visit.           Signed,  Yates Decamp, MD, [facility] 09/30/2023, 3:45 PM [facility] [address] [place] [address] [place], [place] [address] Phone: [phone]. Fax:  [phone]

## 2023-10-22 ENCOUNTER — Ambulatory Visit (HOSPITAL_COMMUNITY)
Admission: RE | Admit: 2023-10-22 | Discharge: 2023-10-22 | Disposition: A | Discharge status: Home / Self Care | Payer: Medicare HMO | Source: Ambulatory Visit | Attending: Cardiology | Admitting: Cardiology

## 2023-10-22 DIAGNOSIS — R0989 Other specified symptoms and signs involving the circulatory and respiratory systems: Secondary | ICD-10-CM

## 2023-10-24 ENCOUNTER — Encounter: Payer: Self-pay | Admitting: Cardiology

## 2023-10-24 ENCOUNTER — Ambulatory Visit (HOSPITAL_COMMUNITY): Payer: Medicare HMO | Attending: Cardiology

## 2023-10-24 DIAGNOSIS — Z951 Presence of aortocoronary bypass graft: Secondary | ICD-10-CM | POA: Insufficient documentation

## 2023-10-24 DIAGNOSIS — I2510 Atherosclerotic heart disease of native coronary artery without angina pectoris: Secondary | ICD-10-CM

## 2023-10-24 LAB — ECHOCARDIOGRAM COMPLETE
Area-P 1/2: 3.3 cm2
S' Lateral: 2.8 cm

## 2023-10-24 NOTE — Progress Notes (Signed)
 As suspected, he does have left carotid artery stenosis, I will discuss more on his office visit.  No surgical intervention required at this time.  He will need blood pressure checked in both arms on his next office visit.  Carotid artery duplex 10/26/2023: Right ICA 1 to 39% stenosis.  Right subtalar artery flow appears stenotic.  Right arm blood pressure 122 mmHg. Left ICA 60 to 79% stenosis, upper end of the range.  Left subclavian artery appears normal, left arm blood pressure 122 mmHg.

## 2023-10-24 NOTE — Progress Notes (Signed)
 Essentially normal echocardiogram.

## 2023-11-18 ENCOUNTER — Encounter: Payer: Self-pay | Admitting: Cardiology

## 2023-11-18 ENCOUNTER — Ambulatory Visit: Payer: Medicare HMO | Attending: Cardiology | Admitting: Cardiology

## 2023-11-18 VITALS — BP 110/64 | HR 61 | Resp 16 | Ht 71.0 in | Wt 194.4 lb

## 2023-11-18 DIAGNOSIS — I10 Essential (primary) hypertension: Secondary | ICD-10-CM | POA: Diagnosis not present

## 2023-11-18 DIAGNOSIS — I6523 Occlusion and stenosis of bilateral carotid arteries: Secondary | ICD-10-CM | POA: Diagnosis not present

## 2023-11-18 DIAGNOSIS — E7800 Pure hypercholesterolemia, unspecified: Secondary | ICD-10-CM

## 2023-11-18 DIAGNOSIS — G453 Amaurosis fugax: Secondary | ICD-10-CM | POA: Diagnosis not present

## 2023-11-18 DIAGNOSIS — I2510 Atherosclerotic heart disease of native coronary artery without angina pectoris: Secondary | ICD-10-CM | POA: Diagnosis not present

## 2023-11-18 DIAGNOSIS — I4819 Other persistent atrial fibrillation: Secondary | ICD-10-CM | POA: Insufficient documentation

## 2023-11-18 MED ORDER — OLMESARTAN MEDOXOMIL-HCTZ 20-12.5 MG PO TABS: 1.0000 | ORAL_TABLET | ORAL | 1 refills | Status: DC

## 2023-11-18 MED ORDER — EZETIMIBE 10 MG PO TABS: 10.0000 mg | ORAL_TABLET | Freq: Every day | ORAL | 3 refills | Status: AC

## 2023-11-18 NOTE — Progress Notes (Signed)
 Cardiology Office Note:  .   Date:  11/18/2023  ID:  Ryan Douglas, DOB 03/25/1951, MRN [id number] PCP: Gweneth Dimitri, MD  [facility] Providers Cardiologist:  Yates Decamp, MD   History of Present Illness: .    Discussed the use of AI scribe software for clinical note transcription with the patient, who gave verbal consent to proceed.  Ryan Douglas is a 73 y.o. Patient with coronary disease SP CABG x 4 on 02/22/2014, primary hypertension & hypercholesterolemia.  Patient not seen a cardiologist in quite a while, he established with me on 09/30/2023, underwent echocardiogram and carotid artery duplex and now presents for follow-up.   The patient reports that the episode occurred after taking a combination of medications, including metoprolol, a new blood pressure medication, and tadalafil. The patient describes the visual disturbance as a fuzziness in the left eye, with difficulty resolving images and a pixelated perception of shadows. The episode resolved after about half an hour.  He has not had any recurrence. Labs  External Labs:  PCP labs 02/26/2023:  Serum glucose 85 mg, BUN 19, creatinine 0.92, EGFR 88 mL, potassium 4.5, LFTs normal.  Hb 14.0/HCT 42.1, platelets 256.  Vitamin D 34.8.  PCP labs 08/28/2022:  Total cholesterol 1174, triglycerides 73, HDL 71, LDL 89.  Review of Systems  Cardiovascular:  Negative for chest pain, dyspnea on exertion and leg swelling.   Physical Exam:   VS:  BP 110/64 (BP Location: Left Arm, Patient Position: Sitting, Cuff Size: Large)   Pulse 61   Resp 16   Ht 5\' 11"  (1.803 m)   Wt 194 lb 6.4 oz (88.2 kg)   SpO2 97%   BMI 27.11 kg/m    Wt Readings from Last 3 Encounters:  11/18/23 194 lb 6.4 oz (88.2 kg)  09/30/23 195 lb 9.6 oz (88.7 kg)  06/09/14 175 lb (79.4 kg)    Physical Exam Neck:     Vascular: Carotid bruit (bilateral) present. No JVD.  Cardiovascular:     Rate and Rhythm: Normal rate and regular rhythm.     Pulses:  Intact distal pulses.     Heart sounds: Normal heart sounds. No murmur heard.    No gallop.  Pulmonary:     Effort: Pulmonary effort is normal.     Breath sounds: Normal breath sounds.  Abdominal:     General: Bowel sounds are normal.     Palpations: Abdomen is soft.  Musculoskeletal:     Right lower leg: No edema.     Left lower leg: No edema.    Studies Reviewed: .    Carotid artery duplex 10/26/2023: Right ICA 1 to 39% stenosis.  Right subtalar artery flow appears stenotic.  Right arm blood pressure 122 mmHg. Left ICA 60 to 79% stenosis, upper end of the range.  Left subclavian artery appears normal, left arm blood pressure 122 mmHg.  ECHOCARDIOGRAM COMPLETE 10/24/2023  1. Left ventricular ejection fraction, by estimation, is 65 to 70%. Left ventricular ejection fraction by 3D volume is 67 %. The left ventricle has normal function. The left ventricle has no regional wall motion abnormalities. There is mild left ventricular hypertrophy of the basal-septal segment. Left ventricular diastolic parameters are consistent with Grade I diastolic dysfunction (impaired relaxation). The average left ventricular global longitudinal strain is -18.8 %. The global longitudinal strain is normal. 2. Right ventricular systolic function is normal. The right ventricular size is normal. 3. The mitral valve is normal in structure. Mild to moderate mitral valve  regurgitation. No evidence of mitral stenosis. 4. The aortic valve is tricuspid. There is mild calcification of the aortic valve. Aortic valve regurgitation is not visualized. No aortic stenosis is present. 5. The inferior vena cava is normal in size with greater than 50% respiratory variability, suggesting right atrial pressure of 3 mmHg.  EKG:        Medications and allergies    Allergies  Allergen Reactions   Morphine And Codeine     Headache   Oxycodone     Headache     Current Outpatient Medications:    aspirin 81 MG chewable  tablet, Chew 2 tablets by mouth daily., Disp: , Rfl:    atorvastatin (LIPITOR) 40 MG tablet, Take 40 mg by mouth daily., Disp: , Rfl:    ezetimibe (ZETIA) 10 MG tablet, Take 1 tablet (10 mg total) by mouth daily., Disp: 90 tablet, Rfl: 3   metoprolol tartrate (LOPRESSOR) 25 MG tablet, Take 1 tablet (25 mg total) by mouth 2 (two) times daily., Disp: 60 tablet, Rfl: 1   Multiple Vitamin (MULTIVITAMIN WITH MINERALS) TABS tablet, Take 1 tablet by mouth daily., Disp: , Rfl:    olmesartan-hydrochlorothiazide (BENICAR HCT) 20-12.5 MG tablet, Take 1 tablet by mouth every morning., Disp: 30 tablet, Rfl: 1   polyethylene glycol (MIRALAX / GLYCOLAX) packet, Take 17 g by mouth daily as needed., Disp: , Rfl:    psyllium (METAMUCIL) 58.6 % packet, Take 1 packet by mouth daily., Disp: , Rfl:    tadalafil (CIALIS) 10 MG tablet, Take 10 mg by mouth daily as needed for erectile dysfunction., Disp: , Rfl:    Meds ordered this encounter  Medications   olmesartan-hydrochlorothiazide (BENICAR HCT) 20-12.5 MG tablet    Sig: Take 1 tablet by mouth every morning.    Dispense:  30 tablet    Refill:  1   ezetimibe (ZETIA) 10 MG tablet    Sig: Take 1 tablet (10 mg total) by mouth daily.    Dispense:  90 tablet    Refill:  3     Medications Discontinued During This Encounter  Medication Reason   olmesartan-hydrochlorothiazide (BENICAR HCT) 40-12.5 MG tablet Dose change     ASSESSMENT AND PLAN: .      ICD-10-CM   1. Amaurosis fugax of left eye  G45.3 Ambulatory referral to Vascular Surgery    2. Bilateral carotid artery stenosis  I65.23 ezetimibe (ZETIA) 10 MG tablet    Ambulatory referral to Vascular Surgery    3. Coronary artery disease involving native coronary artery of native heart without angina pectoris  I25.10     4. Pure hypercholesterolemia  E78.00 ezetimibe (ZETIA) 10 MG tablet    5. Primary hypertension  I10 olmesartan-hydrochlorothiazide (BENICAR HCT) 20-12.5 MG tablet     1. Amaurosis fugax  of left eye (Primary) About couple weeks ago patient has had an episode of fibrosis few checks involving the left eye with sudden onset visual disturbance that lasted for about 30 minutes.  Patient has about a 80% or greater stenosis in the left carotid artery duplex.  He has symptomatic carotid stenosis.  Will make a urgent referral for ambulatory vascular surgery evaluation.  Patient is already on aspirin, continue the same.  - Ambulatory referral to Vascular Surgery  2. Bilateral carotid artery stenosis I have reviewed the results of the carotid artery duplex.  Patient is willing to see vascular surgery. - ezetimibe (ZETIA) 10 MG tablet; Take 1 tablet (10 mg total) by mouth daily.  Dispense:  90 tablet; Refill: 3 - Ambulatory referral to Vascular Surgery  3. Coronary artery disease involving native coronary artery of native heart without angina pectoris For the coronary artery disease standpoint, he has not had any anginal symptoms.  His echocardiogram that was done recently reveals preserved LVEF.  He does not need any stress testing if he were to proceed with TCAR or carotid endarterectomy.  4. Pure hypercholesterolemia The patient also expresses concern about the number of medications he is taking and the potential cost implications. He is concerned about Air traffic [employer].  The patient also mentions a recent life change and a return to a more active lifestyle.  Advised him that I have sent in the prescription for Zetia as his goal LDL is <70 but will leave it to him to decide whether he wants to pursue the medication.  Acuity of his illness especially amaurosis fugax at risk of stroke discussed again.   However, he expressed concerns about taking additional medications and the influence of the pharmaceutical industry, preferring to avoid starting new medications if possible. Prescribe ezetimibe (Zetia) to be taken along with Crestor 40 mg, but allow him to decide whether to  initiate the medication.  - ezetimibe (ZETIA) 10 MG tablet; Take 1 tablet (10 mg total) by mouth daily.  Dispense: 90 tablet; Refill: 3  5. Primary hypertension Blood pressure is very well-controlled since I made changes and added olmesartan HCT.  However in view of recent amaurosis fugax, high-grade stenosis in the left carotid artery, I have reduced the dose of olmesartan HCT to 20/12.5 mg daily to avoid hypotension and let permissive hypertension be in force Would like to see him back in 3 months for follow-up.  - olmesartan-hydrochlorothiazide (BENICAR HCT) 20-12.5 MG tablet; Take 1 tablet by mouth every morning.  Dispense: 30 tablet; Refill: 1  Time spent 37 minutes on discussing the importance of his carotid artery stenosis, need for vascular surgical referral and prevention of stroke and medication effects.   Signed,  Knox Perl, MD, [facility] 11/18/2023, 12:16 PM [facility] [address] [place], [place] [address] Phone: [phone]. Fax:  [phone]

## 2023-11-18 NOTE — Patient Instructions (Signed)
 Medication Instructions:  Your physician has recommended you make the following change in your medication:  Start Zetia 10 mg by mouth daily  Decrease olmesartan hydrochlorothiazide to 20/12.5 mg by mouth daily   *If you need a refill on your cardiac medications before your next appointment, please call your pharmacy*  Lab Work: none If you have labs (blood work) drawn today and your tests are completely normal, you will receive your results only by: MyChart Message (if you have MyChart) OR A paper copy in the mail If you have any lab test that is abnormal or we need to change your treatment, we will call you to review the results.  Testing/Procedures: none  Follow-Up: At [facility], you and your health needs are our priority.  As part of our continuing mission to provide you with exceptional heart care, our providers are all part of one team.  This team includes your primary Cardiologist (physician) and Advanced Practice Providers or APPs (Physician Assistants and Nurse Practitioners) who all work together to provide you with the care you need, when you need it.  Your next appointment:   3 month(s)  Provider:   Knox Perl, MD     We recommend signing up for the patient portal called "MyChart".  Sign up information is provided on this After Visit Summary.  MyChart is used to connect with patients for Virtual Visits (Telemedicine).  Patients are able to view lab/test results, encounter notes, upcoming appointments, etc.  Non-urgent messages can be sent to your provider as well.   To learn more about what you can do with MyChart, go to [email or web address].   Other Instructions You have been referred to V[facility]ists       1st Floor: - Lobby - Registration  - Pharmacy  - Lab - Cafe  2nd Floor: - PV Lab - Diagnostic Testing (echo, CT, nuclear med)  3rd Floor: - Vacant  4th Floor: - TCTS (cardiothoracic surgery) - AFib Clinic - Structural  Heart Clinic - Vascular Surgery  - Vascular Ultrasound  5th Floor: - HeartCare Cardiology (general and EP) - Clinical Pharmacy for coumadin, hypertension, lipid, weight-loss medications, and med management appointments    Valet parking services will be available as well.

## 2023-12-23 ENCOUNTER — Other Ambulatory Visit: Payer: Self-pay | Admitting: Cardiology

## 2023-12-23 DIAGNOSIS — I10 Essential (primary) hypertension: Secondary | ICD-10-CM

## 2023-12-26 ENCOUNTER — Ambulatory Visit: Attending: Vascular Surgery | Admitting: Vascular Surgery

## 2023-12-26 ENCOUNTER — Encounter: Payer: Self-pay | Admitting: Vascular Surgery

## 2023-12-26 VITALS — BP 129/71 | HR 60 | Temp 98.0°F | Resp 18 | Ht 71.0 in | Wt 197.2 lb

## 2023-12-26 DIAGNOSIS — I6522 Occlusion and stenosis of left carotid artery: Secondary | ICD-10-CM

## 2023-12-26 NOTE — Progress Notes (Signed)
 Patient ID: Ryan Douglas, male   DOB: 1951/07/25, 73 y.o.   MRN: [id number]  Reason for Consult: No chief complaint on file.   Referred by Helyn Lobstein, MD  Subjective:  HPI Ryan Douglas is a 73 y.o. male presenting for evaluation of carotid artery stenosis.  He is followed by Dr. Charolett Copes for his coronary artery disease and underwent CABG in 2015.  He had an episode of left visual disturbance where he had some fuzziness and difficulty resolving images but denies temporary blindness.  He also denies any previous stroke or strokelike symptoms.  Specifically denies one-sided weakness, numbness, or speech issues.  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 LIMA-LAD; SVG-OM; SVG-RCA; SVG-DIAG ;  Surgeon: Norita Beauvais, MD;  Location: [facility] OR;  Service: Open Heart Surgery;  Laterality: N/A;   ENDOVEIN HARVEST OF GREATER SAPHENOUS VEIN Right 02/22/2014   Procedure: ENDOVEIN HARVEST OF GREATER SAPHENOUS VEIN;  Surgeon: Norita Beauvais, MD;  Location: [facility] OR;  Service: Open Heart Surgery;  Laterality: Right;   INTRAOPERATIVE TRANSESOPHAGEAL ECHOCARDIOGRAM N/A 02/22/2014   Procedure: INTRAOPERATIVE TRANSESOPHAGEAL ECHOCARDIOGRAM;  Surgeon: Norita Beauvais, MD;  Location: [facility] OR;  Service: Open Heart Surgery;  Laterality:  N/A;   LEFT HEART CATHETERIZATION WITH CORONARY ANGIOGRAM N/A 02/17/2014   Procedure: LEFT HEART CATHETERIZATION WITH CORONARY ANGIOGRAM;  Surgeon: Dorsey Gault, MD;  Location: [facility] CATH LAB;  Service: Cardiovascular;  Laterality: N/A;   ROOT CANAL      Short Social History:  Social History   Tobacco Use   Smoking status: Former   Smokeless tobacco: Never   Tobacco comments:    Quit smoking cigarettes in 1994  Substance Use Topics   Alcohol use: No    Allergies  Allergen Reactions   Morphine  And Codeine     Headache   Oxycodone      Headache    Current Outpatient Medications  Medication Sig Dispense Refill   aspirin  81 MG chewable tablet Chew 2 tablets by mouth daily.     atorvastatin  (LIPITOR) 40 MG tablet Take 40 mg by mouth daily.     ezetimibe  (ZETIA ) 10 MG tablet Take 1 tablet (10 mg total) by mouth daily. 90 tablet 3   metoprolol  tartrate (LOPRESSOR ) 25 MG tablet Take 1 tablet (25 mg total) by mouth 2 (two) times daily. 60 tablet 1   Multiple Vitamin (MULTIVITAMIN WITH MINERALS) TABS tablet Take 1 tablet by mouth daily.     olmesartan -hydrochlorothiazide (BENICAR  HCT) 20-12.5 MG tablet TAKE 1 TABLET BY MOUTH EVERY DAY IN THE MORNING 30 tablet 1   polyethylene glycol (MIRALAX  / GLYCOLAX ) packet Take 17 g by mouth daily as needed.     psyllium (METAMUCIL) 58.6 % packet Take  1 packet by mouth daily.     tadalafil (CIALIS) 10 MG tablet Take 10 mg by mouth daily as needed for erectile dysfunction.     No current facility-administered medications for this visit.    REVIEW OF SYSTEMS  All other systems reviewed and are negative  Objective:  Objective   There were no vitals filed for this visit. There is no height or weight on file to calculate BMI.  Physical Exam General: no acute distress Cardiac: hemodynamically stable Pulm: normal work of breathing Abdomen: non-tender, no pulsatile mass Neuro: alert, no focal deficit Extremities: no edema, cyanosis or  wounds Vascular:   Right: Palpable femoral, DP, radial  Left: Palpable femoral, DP, radial  Data: Carotid duplex Right Carotid Findings:  +----------+--------+--------+--------+------------------+--------+           PSV cm/sEDV cm/sStenosisPlaque DescriptionComments  +----------+--------+--------+--------+------------------+--------+  CCA Prox  126     18                                          +----------+--------+--------+--------+------------------+--------+  CCA Distal95      16                                          +----------+--------+--------+--------+------------------+--------+  ICA Prox  76      21      1-39%   heterogenous                +----------+--------+--------+--------+------------------+--------+  ICA Distal79      20                                          +----------+--------+--------+--------+------------------+--------+  ECA      141     8                                           +----------+--------+--------+--------+------------------+--------+   +----------+--------+-------+----------------------+-------------------+           PSV cm/sEDV cmsDescribe              Arm Pressure (mmHG)  +----------+--------+-------+----------------------+-------------------+  [id number]           Stenotic and Turbulent122                  +----------+--------+-------+----------------------+-------------------+   +---------+--------+--+--------+--+---------+  VertebralPSV cm/s60EDV cm/s10Antegrade  +---------+--------+--+--------+--+---------+      Left Carotid Findings:  +----------+--------+--------+--------+------------------+--------------+           PSV cm/sEDV cm/sStenosisPlaque DescriptionComments        +----------+--------+--------+--------+------------------+--------------+  CCA Prox  121     15                                                 +----------+--------+--------+--------+------------------+--------------+  CCA Distal78      14                                                +----------+--------+--------+--------+------------------+--------------+  ICA Prox  340     107     60-79%  heterogenous      high end range  +----------+--------+--------+--------+------------------+--------------+  ICA Mid   328     69              heterogenous      turbulent       +----------+--------+--------+--------+------------------+--------------+  ECA      177     0       >50%    heterogenous                      +----------+--------+--------+--------+------------------+--------------+   +----------+--------+--------+----------------+-------------------+           PSV cm/sEDV cm/sDescribe        Arm Pressure (mmHG)  +----------+--------+--------+----------------+-------------------+  [id number]            Multiphasic, [id number]                  +----------+--------+--------+----------------+-------------------+   +---------+--------+--+--------+--+---------+  VertebralPSV cm/s61EDV cm/s14Antegrade  +---------+--------+--+--------+--+---------+   Echo reviewed EF 65 to 70%, grade 1 diastolic dysfunction, RV function normal, mild to moderate mitral valve regurg     Assessment/Plan:   Alistar Mcenery is a 73 y.o. male with with carotid artery stenosis, approaching 80% by duplex ultrasound.  I explained that I do not believe his visual disturbance was necessarily amaurosis as he did not have temporary blindness.  But we did discuss the natural history of carotid artery stenosis as well as the indication for treatment of greater than 80% in asymptomatic patients and 50% and symptomatic patients.  I did explain that while he is in the 60 to 79% range based on ultrasound that I do believe he is at the high end of that range and recommended a CTA. He had concerns about getting CTA at this time due  to his financial situation he would like to hold off if possible.  I explained that I do consider him to be asymptomatic and do not believe that his visual disturbance to be a to be amaurosis and more lines up with this medication change that occurred at that time.  We discussed that as an asymptomatic patient less than 80% he does have a low risk of stroke although is not 0.  We also discussed that if this was an atypical symptom of an embolic event to his left eye that that would mean that his plaque is more high risk which would put him at a higher risk of stroke while we delay workup and intervention.  He expressed understanding.    Will plan to see him back in 6 months with a repeat carotid duplex, if at that time he is greater than 80% I explained that we would need to obtain a CTA for intervention planning.  Recommendations to optimize cardiovascular risk: Abstinence from all tobacco products. Blood glucose control with goal A1c < 7%. Blood pressure control with goal blood pressure < 140/90 mmHg. Lipid reduction therapy with goal LDL-C <100 mg/dL  Aspirin  81mg  PO QD.  Atorvastatin  40-80mg  PO QD (or other "high intensity" statin therapy).   Philipp Brawn MD [facility]

## 2023-12-31 ENCOUNTER — Other Ambulatory Visit: Payer: Self-pay | Admitting: Vascular Surgery

## 2023-12-31 DIAGNOSIS — I6522 Occlusion and stenosis of left carotid artery: Secondary | ICD-10-CM

## 2024-01-07 ENCOUNTER — Other Ambulatory Visit: Payer: Self-pay | Admitting: Cardiology

## 2024-01-07 DIAGNOSIS — I10 Essential (primary) hypertension: Secondary | ICD-10-CM

## 2024-01-08 MED ORDER — OLMESARTAN MEDOXOMIL-HCTZ 20-12.5 MG PO TABS: 1.0000 | ORAL_TABLET | Freq: Every morning | ORAL | 3 refills | Status: AC

## 2024-01-29 DIAGNOSIS — N5201 Erectile dysfunction due to arterial insufficiency: Secondary | ICD-10-CM | POA: Diagnosis not present

## 2024-01-29 DIAGNOSIS — Z6826 Body mass index (BMI) 26.0-26.9, adult: Secondary | ICD-10-CM | POA: Diagnosis not present

## 2024-01-29 DIAGNOSIS — R7303 Prediabetes: Secondary | ICD-10-CM | POA: Diagnosis not present

## 2024-01-29 DIAGNOSIS — E785 Hyperlipidemia, unspecified: Secondary | ICD-10-CM | POA: Diagnosis not present

## 2024-01-29 DIAGNOSIS — I10 Essential (primary) hypertension: Secondary | ICD-10-CM | POA: Diagnosis not present

## 2024-01-29 DIAGNOSIS — B351 Tinea unguium: Secondary | ICD-10-CM | POA: Diagnosis not present

## 2024-01-29 DIAGNOSIS — I6521 Occlusion and stenosis of right carotid artery: Secondary | ICD-10-CM | POA: Diagnosis not present

## 2024-01-29 DIAGNOSIS — I2510 Atherosclerotic heart disease of native coronary artery without angina pectoris: Secondary | ICD-10-CM | POA: Diagnosis not present

## 2024-02-04 ENCOUNTER — Other Ambulatory Visit: Payer: Self-pay | Admitting: Cardiology

## 2024-02-04 DIAGNOSIS — I10 Essential (primary) hypertension: Secondary | ICD-10-CM

## 2024-02-12 ENCOUNTER — Ambulatory Visit: Admitting: Cardiology

## 2024-06-09 DIAGNOSIS — F4389 Other reactions to severe stress: Secondary | ICD-10-CM | POA: Diagnosis not present

## 2024-06-17 DIAGNOSIS — F4389 Other reactions to severe stress: Secondary | ICD-10-CM | POA: Diagnosis not present

## 2024-06-17 NOTE — Progress Notes (Unsigned)
 Patient ID: Ryan Douglas, male   DOB: 1951/07/26, 73 y.o.   MRN: [id number]  Reason for Consult: No chief complaint on file.   Referred by Aisha Harvey, MD  Subjective:     HPI Ryan Douglas is a 73 y.o. male with carotid stenosis presenting for follow up. He was last seen in May 2025 and was noted to have stenosis of 60 to 79% and without any flow-limiting stenosis on the right.  Around that time he did have an episode of blurry vision but denied true amaurosis and also believe that it was related to a change in his cardiac medications around the same time. 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 LIMA-LAD; SVG-OM; SVG-RCA; SVG-DIAG ;  Surgeon: Dallas KATHEE Jude, MD;  Location: [facility] OR;  Service: Open Heart Surgery;  Laterality: N/A;   ENDOVEIN HARVEST OF GREATER SAPHENOUS VEIN Right 02/22/2014   Procedure: ENDOVEIN HARVEST OF GREATER SAPHENOUS VEIN;  Surgeon: Dallas KATHEE Jude, MD;  Location: [facility] OR;  Service: Open Heart Surgery;  Laterality: Right;   INTRAOPERATIVE TRANSESOPHAGEAL ECHOCARDIOGRAM N/A 02/22/2014   Procedure: INTRAOPERATIVE TRANSESOPHAGEAL ECHOCARDIOGRAM;  Surgeon: Dallas KATHEE Jude, MD;  Location: [facility] OR;  Service: Open Heart Surgery;  Laterality:  N/A;   LEFT HEART CATHETERIZATION WITH CORONARY ANGIOGRAM N/A 02/17/2014   Procedure: LEFT HEART CATHETERIZATION WITH CORONARY ANGIOGRAM;  Surgeon: Elsie GORMAN Somerset, MD;  Location: [facility] CATH LAB;  Service: Cardiovascular;  Laterality: N/A;   ROOT CANAL      Short Social History:  Social History   Tobacco Use   Smoking status: Former   Smokeless tobacco: Never   Tobacco comments:    Quit smoking cigarettes in 1994  Substance Use Topics   Alcohol use: No    Allergies  Allergen Reactions   Morphine  And Codeine     Headache   Oxycodone      Headache    Current Outpatient Medications  Medication Sig Dispense Refill   aspirin  81 MG chewable tablet Chew 2 tablets by mouth daily.     atorvastatin  (LIPITOR) 40 MG tablet Take 40 mg by mouth daily.     ezetimibe  (ZETIA ) 10 MG tablet Take 1 tablet (10 mg total) by mouth daily. 90 tablet 3   metoprolol  tartrate (LOPRESSOR ) 25 MG tablet Take 1 tablet (25 mg total) by mouth 2 (two) times daily. 60 tablet 1   Multiple Vitamin (MULTIVITAMIN WITH MINERALS) TABS tablet Take 1 tablet by mouth daily.     olmesartan -hydrochlorothiazide (BENICAR  HCT) 20-12.5 MG tablet Take 1 tablet by mouth in the morning. 90 tablet 3   polyethylene glycol (MIRALAX  / GLYCOLAX ) packet Take 17 g by mouth  daily as needed.     psyllium (METAMUCIL) 58.6 % packet Take 1 packet by mouth daily.     tadalafil (CIALIS) 10 MG tablet Take 10 mg by mouth daily as needed for erectile dysfunction.     No current facility-administered medications for this visit.    REVIEW OF SYSTEMS  All other systems were reviewed and are negative     Objective:  Objective   There were no vitals filed for this visit. There is no height or weight on file to calculate BMI.  Physical Exam General: no acute distress Cardiac: hemodynamically stable Abdomen: non-tender, no pulsatile mass*** Extremities: no edema, cyanosis or wounds*** Vascular:   Right: ***  Left: ***  Data: Carotid  duplex ***     Assessment/Plan:   Ryan Douglas is a 73 y.o. male with carotid stenosis. We discussed the results of his duplex today. ***    Norman GORMAN Serve MD [facility]

## 2024-06-18 ENCOUNTER — Ambulatory Visit (HOSPITAL_COMMUNITY)
Admission: RE | Admit: 2024-06-18 | Discharge: 2024-06-18 | Disposition: A | Discharge status: Home / Self Care | Source: Ambulatory Visit | Attending: Vascular Surgery | Admitting: Vascular Surgery

## 2024-06-18 ENCOUNTER — Encounter: Payer: Self-pay | Admitting: Vascular Surgery

## 2024-06-18 ENCOUNTER — Ambulatory Visit: Admitting: Vascular Surgery

## 2024-06-18 VITALS — BP 100/64 | HR 58 | Temp 98.2°F | Ht 71.0 in | Wt 186.0 lb

## 2024-06-18 DIAGNOSIS — I6522 Occlusion and stenosis of left carotid artery: Secondary | ICD-10-CM

## 2024-06-21 ENCOUNTER — Other Ambulatory Visit: Payer: Self-pay

## 2024-06-21 DIAGNOSIS — I6522 Occlusion and stenosis of left carotid artery: Secondary | ICD-10-CM

## 2024-06-21 DIAGNOSIS — R739 Hyperglycemia, unspecified: Secondary | ICD-10-CM | POA: Insufficient documentation

## 2024-06-21 DIAGNOSIS — R7303 Prediabetes: Secondary | ICD-10-CM | POA: Insufficient documentation

## 2024-06-21 DIAGNOSIS — I6521 Occlusion and stenosis of right carotid artery: Secondary | ICD-10-CM | POA: Insufficient documentation

## 2024-06-21 DIAGNOSIS — I10 Essential (primary) hypertension: Secondary | ICD-10-CM | POA: Insufficient documentation

## 2024-06-21 DIAGNOSIS — I77819 Aortic ectasia, unspecified site: Secondary | ICD-10-CM | POA: Insufficient documentation

## 2024-06-21 DIAGNOSIS — G4730 Sleep apnea, unspecified: Secondary | ICD-10-CM | POA: Insufficient documentation

## 2024-06-25 DIAGNOSIS — F4389 Other reactions to severe stress: Secondary | ICD-10-CM | POA: Diagnosis not present

## 2024-06-28 ENCOUNTER — Ambulatory Visit (HOSPITAL_COMMUNITY): Admission: RE | Admit: 2024-06-28 | Source: Ambulatory Visit

## 2024-06-28 ENCOUNTER — Ambulatory Visit (HOSPITAL_COMMUNITY)
Admission: RE | Admit: 2024-06-28 | Discharge: 2024-06-28 | Disposition: A | Discharge status: Home / Self Care | Source: Ambulatory Visit | Attending: Vascular Surgery | Admitting: Vascular Surgery

## 2024-06-28 DIAGNOSIS — R9389 Abnormal findings on diagnostic imaging of other specified body structures: Secondary | ICD-10-CM | POA: Diagnosis not present

## 2024-06-28 DIAGNOSIS — I6522 Occlusion and stenosis of left carotid artery: Secondary | ICD-10-CM | POA: Diagnosis not present

## 2024-06-28 DIAGNOSIS — I6523 Occlusion and stenosis of bilateral carotid arteries: Secondary | ICD-10-CM | POA: Diagnosis not present

## 2024-06-28 DIAGNOSIS — F4389 Other reactions to severe stress: Secondary | ICD-10-CM | POA: Diagnosis not present

## 2024-06-28 DIAGNOSIS — I672 Cerebral atherosclerosis: Secondary | ICD-10-CM | POA: Diagnosis not present

## 2024-06-28 DIAGNOSIS — I6502 Occlusion and stenosis of left vertebral artery: Secondary | ICD-10-CM | POA: Diagnosis not present

## 2024-06-28 MED ORDER — IOHEXOL 350 MG/ML SOLN
75.0000 mL | Freq: Once | INTRAVENOUS | Status: AC | PRN
  Administered 2024-06-28: 75 mL via INTRAVENOUS

## 2024-07-06 DIAGNOSIS — F4389 Other reactions to severe stress: Secondary | ICD-10-CM | POA: Diagnosis not present

## 2024-07-08 NOTE — Progress Notes (Unsigned)
 Patient ID: Ryan Douglas, male   DOB: [birth year], 73 y.o.   MRN: [id number]  Reason for Consult: No chief complaint on file.   Referred by Aisha Harvey, MD  Subjective:     HPI Ryan Douglas is a 73 y.o. male presenting for follow-up.  He is asymptomatic carotid stenosis, left greater than 80%, right without flow-limiting stenosis.  He was seen a few weeks ago we plan to obtain a CTA today he is here to discuss the results.  He continues to be asymptomatic and denies any stroke or strokelike symptoms.  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 LIMA-LAD; SVG-OM; SVG-RCA; SVG-DIAG ;  Surgeon: Dallas KATHEE Jude, MD;  Location: [facility] OR;  Service: Open Heart Surgery;  Laterality: N/A;   ENDOVEIN HARVEST OF GREATER SAPHENOUS VEIN Right 02/22/2014   Procedure: ENDOVEIN HARVEST OF GREATER SAPHENOUS VEIN;  Surgeon: Dallas KATHEE Jude, MD;  Location: [facility] OR;  Service: Open Heart Surgery;  Laterality: Right;   INTRAOPERATIVE TRANSESOPHAGEAL ECHOCARDIOGRAM N/A 02/22/2014   Procedure: INTRAOPERATIVE TRANSESOPHAGEAL ECHOCARDIOGRAM;  Surgeon: Dallas KATHEE Jude, MD;  Location: [facility] OR;  Service: Open Heart Surgery;  Laterality: N/A;   LEFT HEART CATHETERIZATION WITH CORONARY ANGIOGRAM N/A  02/17/2014   Procedure: LEFT HEART CATHETERIZATION WITH CORONARY ANGIOGRAM;  Surgeon: Elsie GORMAN Somerset, MD;  Location: [facility] CATH LAB;  Service: Cardiovascular;  Laterality: N/A;   ROOT CANAL      Short Social History:  Social History   Tobacco Use   Smoking status: Former   Smokeless tobacco: Never   Tobacco comments:    Quit smoking cigarettes in 1994  Substance Use Topics   Alcohol use: No    Allergies  Allergen Reactions   Morphine  And Codeine     Headache   Oxycodone      Headache    Current Outpatient Medications  Medication Sig Dispense Refill   aspirin  81 MG chewable tablet Chew 2 tablets by mouth daily.     atorvastatin  (LIPITOR) 40 MG tablet Take 40 mg by mouth daily.     ezetimibe  (ZETIA ) 10 MG tablet Take 1 tablet (10 mg total) by mouth daily. 90 tablet 3   metoprolol  tartrate (LOPRESSOR ) 25 MG tablet Take 1 tablet (25 mg total) by mouth 2 (two) times daily. 60 tablet 1   Multiple Vitamin (MULTIVITAMIN WITH MINERALS) TABS tablet Take 1 tablet by mouth daily.     olmesartan -hydrochlorothiazide (BENICAR  HCT) 20-12.5 MG tablet Take 1 tablet by mouth in the morning. 90 tablet 3   polyethylene glycol (MIRALAX  / GLYCOLAX ) packet Take 17 g by mouth daily as needed.     psyllium (METAMUCIL) 58.6 % packet Take 1 packet  by mouth daily.     tadalafil (CIALIS) 10 MG tablet Take 10 mg by mouth daily as needed for erectile dysfunction.     No current facility-administered medications for this visit.    REVIEW OF SYSTEMS  All other systems were reviewed and are negative     Objective:  Objective   There were no vitals filed for this visit. There is no height or weight on file to calculate BMI.  Physical Exam General: no acute distress Cardiac: hemodynamically stable Extremities: no edema, cyanosis or wounds   Data: CTA head and neck reviewed Severe stenosis greater than 90% of the left proximal ICA.     Assessment/Plan:   Ryan Douglas is a 73 y.o. male with  asymptomatic carotid stenosis. We discussed the results of his CTA today and I explained that he is a candidate for both carotid endarterectomy and TCAR.  I explained the 3 modalities of treatment with best medical therapy, carotid endarterectomy and carotid stenting. We discussed the risks and benefits of carotid intrevention.  I informed that there is a small risk of stroke during the procedure which is about 1 to 2% but with medical therapy alone there is a 12% risk of ipsilateral stroke over the next 2 years.  I explained that with intervention, that risk is significantly reduced.  We also discussed the risk of cranial nerve injury, bleeding, infection and the small risk of restenosis in the future.  He would like to proceed with ***   Norman GORMAN Serve MD [facility]

## 2024-07-09 ENCOUNTER — Ambulatory Visit: Attending: Vascular Surgery | Admitting: Vascular Surgery

## 2024-07-09 ENCOUNTER — Encounter: Payer: Self-pay | Admitting: Vascular Surgery

## 2024-07-09 VITALS — BP 88/54 | HR 63 | Temp 98.8°F | Resp 18 | Ht 71.0 in | Wt 183.5 lb

## 2024-07-09 DIAGNOSIS — I6523 Occlusion and stenosis of bilateral carotid arteries: Secondary | ICD-10-CM

## 2024-07-14 DIAGNOSIS — F4389 Other reactions to severe stress: Secondary | ICD-10-CM | POA: Diagnosis not present

## 2024-07-15 DIAGNOSIS — N5201 Erectile dysfunction due to arterial insufficiency: Secondary | ICD-10-CM | POA: Diagnosis not present

## 2024-07-15 DIAGNOSIS — Z23 Encounter for immunization: Secondary | ICD-10-CM | POA: Diagnosis not present

## 2024-07-15 DIAGNOSIS — I6522 Occlusion and stenosis of left carotid artery: Secondary | ICD-10-CM | POA: Diagnosis not present

## 2024-07-15 DIAGNOSIS — R7303 Prediabetes: Secondary | ICD-10-CM | POA: Diagnosis not present

## 2024-07-15 DIAGNOSIS — I2510 Atherosclerotic heart disease of native coronary artery without angina pectoris: Secondary | ICD-10-CM | POA: Diagnosis not present

## 2024-07-15 DIAGNOSIS — B351 Tinea unguium: Secondary | ICD-10-CM | POA: Diagnosis not present

## 2024-07-15 DIAGNOSIS — Z6825 Body mass index (BMI) 25.0-25.9, adult: Secondary | ICD-10-CM | POA: Diagnosis not present

## 2024-07-15 DIAGNOSIS — E785 Hyperlipidemia, unspecified: Secondary | ICD-10-CM | POA: Diagnosis not present

## 2024-07-15 DIAGNOSIS — I10 Essential (primary) hypertension: Secondary | ICD-10-CM | POA: Diagnosis not present

## 2024-07-22 DIAGNOSIS — F4389 Other reactions to severe stress: Secondary | ICD-10-CM | POA: Diagnosis not present

## 2024-07-26 DIAGNOSIS — F4389 Other reactions to severe stress: Secondary | ICD-10-CM | POA: Diagnosis not present

## 2024-08-13 ENCOUNTER — Ambulatory Visit: Attending: Vascular Surgery | Admitting: Vascular Surgery

## 2024-08-13 ENCOUNTER — Ambulatory Visit: Admitting: Vascular Surgery

## 2024-08-13 DIAGNOSIS — I6522 Occlusion and stenosis of left carotid artery: Secondary | ICD-10-CM | POA: Diagnosis not present

## 2024-08-13 NOTE — Progress Notes (Signed)
 Vascular surgery progress note  This is documentation of a telephone encounter that occurred on 08/13/2024.  He explained that he understands his stroke risk based on our previous conversations in the office over the last few months but at this time it is wanting to proceed which is medical management.  He explained that he researched both carotid endarterectomy and transcarotid arterial revascularization and at this time is not interested in intervention. He explained that he just does not feel good about asymptomatic surgery.  I explained that that is perfectly reasonable especially since he is put a good amount of thought and research into it he understands that going forward his stroke risk with a greater than 80% lesion is in the 6 to 12% range over the next 4 to 5 years. He will call back if he reconsiders or would like to have another discussion.   Norman GORMAN Serve MD [facility] Phone Number: [phone] 08/13/2024 2:07 PM
